# Patient Record
Sex: Male | Born: 1935 | Hispanic: No | Smoking: Never smoker
Health system: Southern US, Community
[De-identification: ages and names within clinical notes are randomized; demographics above are authoritative.]

## PROBLEM LIST (undated history)

## (undated) DIAGNOSIS — N1832 Chronic kidney disease, stage 3b: Secondary | ICD-10-CM

## (undated) DIAGNOSIS — C61 Malignant neoplasm of prostate: Secondary | ICD-10-CM

## (undated) DIAGNOSIS — C801 Malignant (primary) neoplasm, unspecified: Secondary | ICD-10-CM

## (undated) DIAGNOSIS — N200 Calculus of kidney: Secondary | ICD-10-CM

## (undated) DIAGNOSIS — I4891 Unspecified atrial fibrillation: Secondary | ICD-10-CM

## (undated) DIAGNOSIS — E119 Type 2 diabetes mellitus without complications: Secondary | ICD-10-CM

## (undated) DIAGNOSIS — H3530 Unspecified macular degeneration: Secondary | ICD-10-CM

## (undated) DIAGNOSIS — E1142 Type 2 diabetes mellitus with diabetic polyneuropathy: Secondary | ICD-10-CM

## (undated) DIAGNOSIS — E785 Hyperlipidemia, unspecified: Secondary | ICD-10-CM

## (undated) DIAGNOSIS — Z6827 Body mass index (BMI) 27.0-27.9, adult: Secondary | ICD-10-CM

## (undated) HISTORY — DX: Unspecified atrial fibrillation: I48.91

## (undated) HISTORY — DX: Hyperlipidemia, unspecified: E78.5

## (undated) HISTORY — DX: Malignant neoplasm of prostate: C61

## (undated) HISTORY — PX: APPENDECTOMY: SHX54

## (undated) HISTORY — DX: Calculus of kidney: N20.0

## (undated) HISTORY — DX: Chronic kidney disease, stage 3b: N18.32

## (undated) HISTORY — DX: Type 2 diabetes mellitus with diabetic polyneuropathy: E11.42

## (undated) HISTORY — DX: Body mass index (BMI) 27.0-27.9, adult: Z68.27

## (undated) HISTORY — DX: Type 2 diabetes mellitus without complications: E11.9

---

## 2007-07-30 ENCOUNTER — Ambulatory Visit: Admission: RE | Admit: 2007-07-30 | Discharge: 2007-09-11 | Payer: Self-pay | Admitting: Radiation Oncology

## 2007-09-18 ENCOUNTER — Ambulatory Visit: Admission: RE | Admit: 2007-09-18 | Discharge: 2007-10-01 | Payer: Self-pay | Admitting: Radiation Oncology

## 2008-05-05 ENCOUNTER — Ambulatory Visit: Admission: RE | Admit: 2008-05-05 | Discharge: 2008-08-03 | Payer: Self-pay | Admitting: Radiation Oncology

## 2008-06-11 ENCOUNTER — Ambulatory Visit (HOSPITAL_BASED_OUTPATIENT_CLINIC_OR_DEPARTMENT_OTHER): Admission: RE | Admit: 2008-06-11 | Discharge: 2008-06-11 | Payer: Self-pay | Admitting: Urology

## 2010-05-31 NOTE — Op Note (Signed)
NAME:  Troy Mack, POLIO NO.:  [id number]   MEDICAL RECORD NO.:  [id number]          PATIENT TYPE:  AMB   LOCATION:                               FACILITY:  [facility]   PHYSICIAN:  Excell Seltzer. Annabell Howells, M.D.    DATE OF BIRTH:  1927/05/22   DATE OF PROCEDURE:  DATE OF DISCHARGE:                               OPERATIVE REPORT   PROCEDURE:  Prostate seed implantation and cystoscopy.   PREOPERATIVE DIAGNOSIS:  T1c Gleason 6 adenocarcinoma of the prostate.   POSTOPERATIVE DIAGNOSIS:  T1c Gleason 6 adenocarcinoma of the prostate.   SURGEON:  Bjorn Pippin, MD.   RADIATION ONCOLOGIST:  Billie Lade, MD.   ANESTHESIA:  General.   DRAINS:  16 French Foley catheter.   COMPLICATIONS:  None.   INDICATIONS:  Troy Mack is a 77 year old white male with a Gleason 6  T1c prostate cancer and has a elected seed implantation.  His prostate  volume was originally in excess of 70 mL so he had been on Avodart with  a reduction of his prostate volume to approximately 59 mL and was felt  to be a candidate for seeds.  He remains on Avodart and Flomax.   FINDINGS AT PROCEDURE:  The patient was given Cipro.  He was taken to  the operating room where general anesthetic was induced.  He was fitted  with PAS hose and placed in lithotomy position.  A red rubber catheter  was placed in the rectum.  The perineum was clipped.  His genitalia was  prepped with Betadine solution and a 16 French Foley catheter was  placed.  The balloon was filled with dilute contrast.  The catheter and  genitalia were secured out of the perineal field with an OpSite.  The  ultrasound probe was then assembled, secured to the fixed base, inserted  and the position was adjusted.  The perineum was then prepped with  Betadine solution and the seed guide templating was placed.  Stabilization needles were then placed into the prostate.  At this point  using the Nucletron device the prostate was contoured and the seed plan  was created.   Once the seed plan had been created I returned to the procedure and  placed the needles per the intraoperative plan.  A total of 26 needles  were used with a total of 82 seeds implanted.  In the midportion of the  procedure while implanting the C3 locus we did have a failed delivery  that required hand delivery of the seeds and replacement of the delivery  cannula.  No other problems occurred during the procedure.  Once all  seeds were in position the ultrasound stabilization needles and  ultrasound probe were removed.  The Foley catheter was removed and  cystoscopy was performed with a 16 French flexible scope after reprep of  the genitalia.  This examination revealed a normal urethra.  The  external sphincter was intact.  The prostatic urethra had been  penetrated by a needle.  There was a spacer in the prostatic urethra.  However, I did not see any seeds either in the  prostatic urethra or the  bladder.  The prostate was somewhat elongated with bilobar hyperplasia.  The bladder had mild trabeculation.  No tumors or stones were noted.  The ureteral orifices were unremarkable.  There was a small amount of  clot at the base of the bladder.  The cystoscope was then removed.  The  fresh 16 French Foley catheter was placed.  The bladder was irrigated  with return of the small clots and then the catheter was placed to  straight drainage.  The patient was taken down from lithotomy position  after his perineum had been cleansed and dressed.  His anesthetic was  reversed.  He was removed to the recovery room in stable condition.  There were no complications.  At the end of the procedure before he was  taken down from lithotomy position fluoroscopy was removed to evaluate  the seed position.  There were some trailing seeds from the C3 position  but the remainder of the implant was felt to be appropriately placed and  then he was taken down from lithotomy position, his anesthetic  was  reversed, he was removed to recovery in stable condition.  There were no  other complications.      Excell Seltzer. Annabell Howells, M.D.  Electronically Signed     JJW/MEDQ  D:  06/11/2008  T:  06/11/2008  Job:  [id number]   cc:   Billie Lade, M.D.  Fax: [phone]   Jeanmarie Plant  Fax: [phone]

## 2012-01-25 ENCOUNTER — Observation Stay (HOSPITAL_COMMUNITY): Payer: Medicare Other

## 2012-01-25 ENCOUNTER — Encounter (HOSPITAL_COMMUNITY): Payer: Self-pay | Admitting: Emergency Medicine

## 2012-01-25 ENCOUNTER — Inpatient Hospital Stay (HOSPITAL_COMMUNITY): Payer: Medicare Other | Admitting: Anesthesiology

## 2012-01-25 ENCOUNTER — Inpatient Hospital Stay (HOSPITAL_COMMUNITY)
Admission: EM | Admit: 2012-01-25 | Discharge: 2012-01-26 | DRG: 419 | Disposition: A | Discharge status: Home / Self Care | Payer: Medicare Other | Attending: General Surgery | Admitting: General Surgery

## 2012-01-25 ENCOUNTER — Encounter (HOSPITAL_COMMUNITY): Payer: Self-pay | Admitting: Anesthesiology

## 2012-01-25 ENCOUNTER — Encounter (HOSPITAL_COMMUNITY): Admission: EM | Disposition: A | Discharge status: Home / Self Care | Payer: Self-pay | Source: Home / Self Care

## 2012-01-25 DIAGNOSIS — K810 Acute cholecystitis: Principal | ICD-10-CM | POA: Diagnosis present

## 2012-01-25 DIAGNOSIS — Z87442 Personal history of urinary calculi: Secondary | ICD-10-CM

## 2012-01-25 DIAGNOSIS — Z79899 Other long term (current) drug therapy: Secondary | ICD-10-CM

## 2012-01-25 DIAGNOSIS — Z886 Allergy status to analgesic agent status: Secondary | ICD-10-CM

## 2012-01-25 DIAGNOSIS — H3530 Unspecified macular degeneration: Secondary | ICD-10-CM | POA: Diagnosis present

## 2012-01-25 DIAGNOSIS — Z8546 Personal history of malignant neoplasm of prostate: Secondary | ICD-10-CM

## 2012-01-25 DIAGNOSIS — K8010 Calculus of gallbladder with chronic cholecystitis without obstruction: Secondary | ICD-10-CM

## 2012-01-25 DIAGNOSIS — Z9089 Acquired absence of other organs: Secondary | ICD-10-CM

## 2012-01-25 HISTORY — PX: CHOLECYSTECTOMY: SHX55

## 2012-01-25 HISTORY — DX: Calculus of kidney: N20.0

## 2012-01-25 HISTORY — DX: Malignant (primary) neoplasm, unspecified: C80.1

## 2012-01-25 HISTORY — DX: Unspecified macular degeneration: H35.30

## 2012-01-25 LAB — CBC WITH DIFFERENTIAL/PLATELET
Hemoglobin: 13.6 g/dL (ref 13.0–17.0)
Lymphocytes Relative: 10 % — ABNORMAL LOW (ref 12–46)
Lymphs Abs: 1.6 10*3/uL (ref 0.7–4.0)
Monocytes Relative: 8 % (ref 3–12)
Neutro Abs: 13 10*3/uL — ABNORMAL HIGH (ref 1.7–7.7)
Neutrophils Relative %: 82 % — ABNORMAL HIGH (ref 43–77)
Platelets: 272 10*3/uL (ref 150–400)
RBC: 4.38 MIL/uL (ref 4.22–5.81)
WBC: 15.9 10*3/uL — ABNORMAL HIGH (ref 4.0–10.5)

## 2012-01-25 LAB — COMPREHENSIVE METABOLIC PANEL
BUN: 18 mg/dL (ref 6–23)
CO2: 25 mEq/L (ref 19–32)
Calcium: 9.2 mg/dL (ref 8.4–10.5)
Chloride: 102 mEq/L (ref 96–112)
Creatinine, Ser: 1.26 mg/dL (ref 0.50–1.35)
GFR calc non Af Amer: 51 mL/min — ABNORMAL LOW (ref >90)
Total Bilirubin: 1.1 mg/dL (ref 0.3–1.2)

## 2012-01-25 SURGERY — LAPAROSCOPIC CHOLECYSTECTOMY
Anesthesia: General | Site: Abdomen | Wound class: Contaminated

## 2012-01-25 MED ORDER — DEXAMETHASONE SODIUM PHOSPHATE 4 MG/ML IJ SOLN
INTRAMUSCULAR | Status: DC | PRN
  Administered 2012-01-25: 8 mg via INTRAVENOUS

## 2012-01-25 MED ORDER — HYDROMORPHONE HCL PF 1 MG/ML IJ SOLN
0.5000 mg | INTRAMUSCULAR | Status: DC | PRN
  Administered 2012-01-25 (×3): 0.5 mg via INTRAVENOUS
  Filled 2012-01-25 (×3): qty 1

## 2012-01-25 MED ORDER — FENTANYL CITRATE 0.05 MG/ML IJ SOLN: 25.0000 ug | INTRAMUSCULAR | Status: DC | PRN

## 2012-01-25 MED ORDER — ONDANSETRON HCL 4 MG/2ML IJ SOLN: 4.0000 mg | Freq: Four times a day (QID) | INTRAMUSCULAR | Status: DC | PRN

## 2012-01-25 MED ORDER — SODIUM CHLORIDE 0.9 % IR SOLN
Status: DC | PRN
  Administered 2012-01-25 (×2): 1000 mL

## 2012-01-25 MED ORDER — LIDOCAINE HCL (CARDIAC) 20 MG/ML IV SOLN
INTRAVENOUS | Status: DC | PRN
  Administered 2012-01-25: 60 mg via INTRAVENOUS

## 2012-01-25 MED ORDER — DEXTROSE 5 % IV SOLN
2.0000 g | Freq: Four times a day (QID) | INTRAVENOUS | Status: DC
  Administered 2012-01-25 – 2012-01-26 (×5): 2 g via INTRAVENOUS
  Filled 2012-01-25 (×11): qty 2

## 2012-01-25 MED ORDER — MIDAZOLAM HCL 5 MG/5ML IJ SOLN
INTRAMUSCULAR | Status: DC | PRN
  Administered 2012-01-25: 1 mg via INTRAVENOUS

## 2012-01-25 MED ORDER — BUPIVACAINE-EPINEPHRINE 0.25% -1:200000 IJ SOLN
INTRAMUSCULAR | Status: AC
  Filled 2012-01-25: qty 1

## 2012-01-25 MED ORDER — 0.9 % SODIUM CHLORIDE (POUR BTL) OPTIME
TOPICAL | Status: DC | PRN
  Administered 2012-01-25: 1000 mL

## 2012-01-25 MED ORDER — OXYCODONE-ACETAMINOPHEN 5-325 MG PO TABS
1.0000 | ORAL_TABLET | Freq: Four times a day (QID) | ORAL | Status: DC | PRN
  Administered 2012-01-25: 2 via ORAL
  Filled 2012-01-25: qty 2

## 2012-01-25 MED ORDER — HEMOSTATIC AGENTS (NO CHARGE) OPTIME
TOPICAL | Status: DC | PRN
  Administered 2012-01-25: 1 via TOPICAL

## 2012-01-25 MED ORDER — PROPOFOL 10 MG/ML IV BOLUS
INTRAVENOUS | Status: DC | PRN
  Administered 2012-01-25: 200 mg via INTRAVENOUS

## 2012-01-25 MED ORDER — FENTANYL CITRATE 0.05 MG/ML IJ SOLN
INTRAMUSCULAR | Status: DC | PRN
  Administered 2012-01-25: 50 ug via INTRAVENOUS
  Administered 2012-01-25 (×2): 100 ug via INTRAVENOUS

## 2012-01-25 MED ORDER — ONDANSETRON HCL 4 MG/2ML IJ SOLN
INTRAMUSCULAR | Status: DC | PRN
  Administered 2012-01-25: 4 mg via INTRAVENOUS

## 2012-01-25 MED ORDER — METOCLOPRAMIDE HCL 5 MG/ML IJ SOLN: 10.0000 mg | Freq: Once | INTRAMUSCULAR | Status: DC | PRN

## 2012-01-25 MED ORDER — GLYCOPYRROLATE 0.2 MG/ML IJ SOLN
INTRAMUSCULAR | Status: DC | PRN
  Administered 2012-01-25: 0.4 mg via INTRAVENOUS

## 2012-01-25 MED ORDER — ROCURONIUM BROMIDE 100 MG/10ML IV SOLN
INTRAVENOUS | Status: DC | PRN
  Administered 2012-01-25: 10 mg via INTRAVENOUS
  Administered 2012-01-25: 40 mg via INTRAVENOUS

## 2012-01-25 MED ORDER — NEOSTIGMINE METHYLSULFATE 1 MG/ML IJ SOLN
INTRAMUSCULAR | Status: DC | PRN
  Administered 2012-01-25: 3 mg via INTRAVENOUS

## 2012-01-25 MED ORDER — SODIUM CHLORIDE 0.9 % IV SOLN
INTRAVENOUS | Status: DC | PRN
  Administered 2012-01-25: 13:00:00

## 2012-01-25 MED ORDER — BIOTENE DRY MOUTH MT LIQD
15.0000 mL | Freq: Two times a day (BID) | OROMUCOSAL | Status: DC
  Administered 2012-01-25: 15 mL via OROMUCOSAL

## 2012-01-25 MED ORDER — ONDANSETRON HCL 4 MG/2ML IJ SOLN
4.0000 mg | Freq: Once | INTRAMUSCULAR | Status: AC
  Administered 2012-01-25: 4 mg via INTRAVENOUS
  Filled 2012-01-25: qty 2

## 2012-01-25 MED ORDER — LABETALOL HCL 5 MG/ML IV SOLN
INTRAVENOUS | Status: DC | PRN
  Administered 2012-01-25: 5 mg via INTRAVENOUS

## 2012-01-25 MED ORDER — EPHEDRINE SULFATE 50 MG/ML IJ SOLN
INTRAMUSCULAR | Status: DC | PRN
  Administered 2012-01-25: 10 mg via INTRAVENOUS
  Administered 2012-01-25: 5 mg via INTRAVENOUS

## 2012-01-25 MED ORDER — HYDROMORPHONE HCL PF 1 MG/ML IJ SOLN
0.5000 mg | Freq: Once | INTRAMUSCULAR | Status: AC
  Administered 2012-01-25: 0.5 mg via INTRAVENOUS
  Filled 2012-01-25: qty 1

## 2012-01-25 MED ORDER — LACTATED RINGERS IV SOLN
INTRAVENOUS | Status: DC | PRN
  Administered 2012-01-25 (×2): via INTRAVENOUS

## 2012-01-25 MED ORDER — POTASSIUM CHLORIDE IN NACL 20-0.9 MEQ/L-% IV SOLN
INTRAVENOUS | Status: DC
  Administered 2012-01-25 (×2): via INTRAVENOUS
  Filled 2012-01-25 (×6): qty 1000

## 2012-01-25 MED ORDER — HYDROMORPHONE HCL PF 1 MG/ML IJ SOLN
INTRAMUSCULAR | Status: DC | PRN
  Administered 2012-01-25: 0.5 mg via INTRAVENOUS

## 2012-01-25 MED ORDER — BUPIVACAINE-EPINEPHRINE 0.25% -1:200000 IJ SOLN
INTRAMUSCULAR | Status: DC | PRN
  Administered 2012-01-25: 17 mL

## 2012-01-25 MED ORDER — CHLORHEXIDINE GLUCONATE 0.12 % MT SOLN
15.0000 mL | Freq: Two times a day (BID) | OROMUCOSAL | Status: DC
  Administered 2012-01-25 (×2): 15 mL via OROMUCOSAL
  Filled 2012-01-25 (×2): qty 15

## 2012-01-25 SURGICAL SUPPLY — 52 items
APPLIER CLIP 5 13 M/L LIGAMAX5 (MISCELLANEOUS) ×3
APPLIER CLIP ROT 10 11.4 M/L (STAPLE)
BLADE SURG ROTATE 9660 (MISCELLANEOUS) ×3 IMPLANT
CANISTER SUCTION 2500CC (MISCELLANEOUS) ×3 IMPLANT
CHLORAPREP W/TINT 26ML (MISCELLANEOUS) ×3 IMPLANT
CLIP APPLIE 5 13 M/L LIGAMAX5 (MISCELLANEOUS) ×2 IMPLANT
CLIP APPLIE ROT 10 11.4 M/L (STAPLE) IMPLANT
CLOTH BEACON ORANGE TIMEOUT ST (SAFETY) ×3 IMPLANT
COVER MAYO STAND STRL (DRAPES) ×3 IMPLANT
COVER SURGICAL LIGHT HANDLE (MISCELLANEOUS) ×3 IMPLANT
DECANTER SPIKE VIAL GLASS SM (MISCELLANEOUS) ×3 IMPLANT
DERMABOND ADVANCED (GAUZE/BANDAGES/DRESSINGS) ×1
DERMABOND ADVANCED .7 DNX12 (GAUZE/BANDAGES/DRESSINGS) ×2 IMPLANT
DRAPE C-ARM 42X72 X-RAY (DRAPES) ×3 IMPLANT
DRAPE UTILITY 15X26 W/TAPE STR (DRAPE) ×6 IMPLANT
ELECT REM PT RETURN 9FT ADLT (ELECTROSURGICAL) ×3
ELECTRODE REM PT RTRN 9FT ADLT (ELECTROSURGICAL) ×2 IMPLANT
FILTER SMOKE EVAC LAPAROSHD (FILTER) ×3 IMPLANT
GLOVE BIO SURGEON STRL SZ7 (GLOVE) ×3 IMPLANT
GLOVE BIO SURGEON STRL SZ7.5 (GLOVE) ×3 IMPLANT
GLOVE BIO SURGEON STRL SZ8 (GLOVE) ×3 IMPLANT
GLOVE BIOGEL PI IND STRL 6.5 (GLOVE) ×2 IMPLANT
GLOVE BIOGEL PI IND STRL 7.0 (GLOVE) ×4 IMPLANT
GLOVE BIOGEL PI IND STRL 7.5 (GLOVE) ×4 IMPLANT
GLOVE BIOGEL PI IND STRL 8 (GLOVE) ×6 IMPLANT
GLOVE BIOGEL PI INDICATOR 6.5 (GLOVE) ×1
GLOVE BIOGEL PI INDICATOR 7.0 (GLOVE) ×2
GLOVE BIOGEL PI INDICATOR 7.5 (GLOVE) ×2
GLOVE BIOGEL PI INDICATOR 8 (GLOVE) ×3
GLOVE ECLIPSE 6.5 STRL STRAW (GLOVE) ×3 IMPLANT
GLOVE ECLIPSE 7.0 STRL STRAW (GLOVE) ×3 IMPLANT
GLOVE ECLIPSE 7.5 STRL STRAW (GLOVE) ×3 IMPLANT
GOWN PREVENTION PLUS XLARGE (GOWN DISPOSABLE) ×3 IMPLANT
GOWN STRL NON-REIN LRG LVL3 (GOWN DISPOSABLE) ×18 IMPLANT
HEMOSTAT SNOW SURGICEL 2X4 (HEMOSTASIS) ×3 IMPLANT
KIT BASIN OR (CUSTOM PROCEDURE TRAY) ×3 IMPLANT
KIT ROOM TURNOVER OR (KITS) ×3 IMPLANT
NS IRRIG 1000ML POUR BTL (IV SOLUTION) ×3 IMPLANT
PAD ARMBOARD 7.5X6 YLW CONV (MISCELLANEOUS) ×3 IMPLANT
POUCH SPECIMEN RETRIEVAL 10MM (ENDOMECHANICALS) ×3 IMPLANT
SCISSORS LAP 5X35 DISP (ENDOMECHANICALS) ×3 IMPLANT
SET CHOLANGIOGRAPH 5 50 .035 (SET/KITS/TRAYS/PACK) ×3 IMPLANT
SET IRRIG TUBING LAPAROSCOPIC (IRRIGATION / IRRIGATOR) ×3 IMPLANT
SPECIMEN JAR SMALL (MISCELLANEOUS) ×3 IMPLANT
SUT VIC AB 4-0 PS2 27 (SUTURE) ×3 IMPLANT
TOWEL OR 17X24 6PK STRL BLUE (TOWEL DISPOSABLE) IMPLANT
TOWEL OR 17X26 10 PK STRL BLUE (TOWEL DISPOSABLE) ×3 IMPLANT
TOWEL OR NON WOVEN STRL DISP B (DISPOSABLE) ×3 IMPLANT
TRAY LAPAROSCOPIC (CUSTOM PROCEDURE TRAY) ×3 IMPLANT
TROCAR HASSON GELL 12X100 (TROCAR) ×3 IMPLANT
TROCAR Z-THREAD FIOS 5X100MM (TROCAR) ×9 IMPLANT
WATER STERILE IRR 1000ML POUR (IV SOLUTION) IMPLANT

## 2012-01-25 NOTE — ED Notes (Signed)
Dr Magnus Ivan paged regarding pts arrival

## 2012-01-25 NOTE — Preoperative (Signed)
Beta Blockers   Reason not to administer Beta Blockers:Not Applicable 

## 2012-01-25 NOTE — Progress Notes (Signed)
  Subjective: RUQ pain improved  Objective: Vital signs in last 24 hours: Temp:  [98 F (36.7 C)-98.6 F (37 C)] 98.6 F (37 C) (01/09 0226) Pulse Rate:  [76-83] 80  (01/09 0226) Resp:  [20] 20  (01/09 0226) BP: (111-132)/(51-62) 132/62 mmHg (01/09 0226) SpO2:  [93 %-100 %] 97 % (01/09 0226) Weight:  [86.183 kg (190 lb)] 86.183 kg (190 lb) (01/09 0016) Last BM Date: 01/24/12  Intake/Output from previous day: 01/08 0701 - 01/09 0700 In: 230 [I.V.:230] Out: 320 [Urine:320] Intake/Output this shift:    General appearance: alert and cooperative Resp: clear to auscultation bilaterally Cardio: regular rate and rhythm GI: soft, tender RUQ, no masses  Lab Results:   Basename 01/25/12 0056  WBC 15.9*  HGB 13.6  HCT 39.6  PLT 272   BMET  Basename 01/25/12 0056  NA 135  K 4.5  CL 102  CO2 25  GLUCOSE 112*  BUN 18  CREATININE 1.26  CALCIUM 9.2   PT/INR No results found for this basename: LABPROT:2,INR:2 in the last 72 hours ABG No results found for this basename: PHART:2,PCO2:2,PO2:2,HCO3:2 in the last 72 hours  Studies/Results: Dg Chest 2 View  01/25/2012  *RADIOLOGY REPORT*  Clinical Data: Abdominal pain and nausea.  CHEST - 2 VIEW  Comparison: PA and lateral chest 05/18/2008.  Findings: Linear atelectasis is seen in the lung bases.  Lungs otherwise clear.  No pneumothorax or effusion.  Heart size upper normal.  IMPRESSION: No acute finding.  Bibasilar atelectasis.   Original Report Authenticated By: Holley Dexter, M.D.     Anti-infectives: Anti-infectives     Start     Dose/Rate Route Frequency Ordered Stop   01/25/12 0230   cefOXitin (MEFOXIN) 2 g in dextrose 5 % 50 mL IVPB        2 g 100 mL/hr over 30 Minutes Intravenous 4 times per day 01/25/12 0228            Assessment/Plan: s/p Procedure(s) (LRB) with comments: LAPAROSCOPIC CHOLECYSTECTOMY WITH INTRAOPERATIVE CHOLANGIOGRAM (N/A) Acute cholecystitis - fro lap chole/IOC today. CT reviewed.  I  spoke to him and his son. I discussed the procedure in detail.  We discussed the risks and benefits of a laparoscopic cholecystectomy and possible cholangiogram including, but not limited to bleeding, infection, injury to surrounding structures such as the intestine or liver, bile leak, retained gallstones, need to convert to an open procedure, prolonged diarrhea, blood clots such as  DVT, common bile duct injury, anesthesia risks, and possible need for additional procedures.  The likelihood of improvement in symptoms and return to the patient's normal status is good. We discussed the typical post-operative recovery course.  Patient agrees.   LOS: 0 days    Waylon Hershey E 01/25/2012

## 2012-01-25 NOTE — Anesthesia Preprocedure Evaluation (Addendum)
Anesthesia Evaluation  Patient identified by MRN, date of birth, ID band Patient awake    Reviewed: Allergy & Precautions, H&P , NPO status , Patient's Chart, lab work & pertinent test results, reviewed documented beta blocker date and time   Airway Mallampati: II TM Distance: >3 FB Neck ROM: full    Dental  (+) Poor Dentition, Edentulous Upper, Partial Lower and Dental Advisory Given,    Pulmonary neg pulmonary ROS,  breath sounds clear to auscultation        Cardiovascular negative cardio ROS  Rhythm:regular     Neuro/Psych negative neurological ROS  negative psych ROS   GI/Hepatic negative GI ROS, Neg liver ROS,   Endo/Other  negative endocrine ROS  Renal/GU Renal disease  negative genitourinary   Musculoskeletal   Abdominal   Peds  Hematology negative hematology ROS (+)   Anesthesia Other Findings See surgeon's H&P   Reproductive/Obstetrics negative OB ROS                          Anesthesia Physical Anesthesia Plan  ASA: II and emergent  Anesthesia Plan: General   Post-op Pain Management:    Induction: Intravenous, Rapid sequence and Cricoid pressure planned  Airway Management Planned: Oral ETT  Additional Equipment:   Intra-op Plan:   Post-operative Plan: Extubation in OR  Informed Consent: I have reviewed the patients History and Physical, chart, labs and discussed the procedure including the risks, benefits and alternatives for the proposed anesthesia with the patient or authorized representative who has indicated his/her understanding and acceptance.   Dental Advisory Given  Plan Discussed with: CRNA and Surgeon  Anesthesia Plan Comments:         Anesthesia Quick Evaluation

## 2012-01-25 NOTE — ED Notes (Signed)
Pt transferred from [facility] to [facility] via EMS. Pt stable. No signs of distress. Pt states he has been having abdominal pain since last thursday and started vomiting on Friday. Pt is transferred here for Acute Cholecystitis. No family at present time. VS stable. 20g IV in place in left AC, intact with fluids going.

## 2012-01-25 NOTE — Transfer of Care (Signed)
Immediate Anesthesia Transfer of Care Note  Patient: Troy Mack  Procedure(s) Performed: Procedure(s) (LRB) with comments: LAPAROSCOPIC CHOLECYSTECTOMY (N/A)  Patient Location: PACU  Anesthesia Type:General  Level of Consciousness: awake, alert  and oriented  Airway & Oxygen Therapy: Patient Spontanous Breathing and Patient connected to nasal cannula oxygen  Post-op Assessment: Report given to PACU RN and Post -op Vital signs reviewed and stable  Post vital signs: Reviewed and stable  Complications: No apparent anesthesia complications

## 2012-01-25 NOTE — ED Provider Notes (Signed)
History     CSN: [id number]  Arrival date & time 01/25/12  0001   First MD Initiated Contact with Patient 01/25/12 0014      Chief Complaint  Patient presents with  . Abdominal Pain  . Nausea    (Consider location/radiation/quality/duration/timing/severity/associated sxs/prior treatment) HPIWilliam Mack is a 79 y.o. male who presents with right upper quadrant pain as a transfer from [facility].  Patient says about last Thursday he ate some collard greens and "some other things" he started having right upper quadrant pain. He has had some associated nausea and vomiting as well. The pain was worse today, is been severe, it is currently 8/10, sharp, right upper quadrant, nonradiating, associated with some mild nausea, no diarrhea, no fevers, no chills.  Patient has no other pertinent medical history.  Was treated for prostate cancer in 2010 bradycardia therapy, he was taking Flomax but no longer takes it because he hasn't needed. Currently takes a baby aspirin daily. No known heart disease, no known lung disease. No diabetes.   Past Medical History  Diagnosis Date  . Kidney stones   . Cancer     Prostate May 2010 diagnosed  . Macular degeneration     Past Surgical History  Procedure Date  . Appendectomy     History reviewed. No pertinent family history.  History  Substance Use Topics  . Smoking status: Never Smoker   . Smokeless tobacco: Never Used  . Alcohol Use: No      Review of Systems At least 10pt or greater review of systems completed and are negative except where specified in the HPI.  Allergies  Asa  Home Medications  No current outpatient prescriptions on file.  BP 111/57  Pulse 76  Temp 98 F (36.7 C) (Oral)  Resp 20  Ht 6\' 2"  (1.88 m)  Wt 190 lb (86.183 kg)  BMI 24.39 kg/m2  SpO2 97%  Physical Exam  Nursing notes reviewed.  Electronic medical record reviewed. VITAL SIGNS:   Filed Vitals:   01/25/12 0003 01/25/12 0010  01/25/12 0016  BP: 122/51  111/57  Pulse: 83  76  Temp: 98 F (36.7 C)    TempSrc: Oral    Resp: 20  20  Height:   6\' 2"  (1.88 m)  Weight:   190 lb (86.183 kg)  SpO2: 100% 93% 97%   CONSTITUTIONAL: Awake, oriented, appears non-toxic HENT: Atraumatic, normocephalic, oral mucosa pink and moist, airway patent. Nares patent without drainage. External ears normal. EYES: Conjunctiva clear, EOMI, PERRLA NECK: Trachea midline, non-tender, supple CARDIOVASCULAR: Normal heart rate, Normal rhythm, No murmurs, rubs, gallops PULMONARY/CHEST: Clear to auscultation, no rhonchi, wheezes, or rales. Symmetrical breath sounds. Non-tender. ABDOMINAL: Non-distended, soft, right upper quadrant tenderness to palpation without rebound, voluntary guarding.  Positive sonographic Murphy. BS normal. NEUROLOGIC: Non-focal, moving all four extremities, no gross sensory or motor deficits. EXTREMITIES: No clubbing, cyanosis, or edema SKIN: Warm, Dry, No erythema, No rash  ED Course  [place] bedside Performed by: Jones Skene Authorized by: Jones Skene Consent: Verbal consent obtained. Comments: Bedside ultrasound reveals a distended gallbladder with gallstones in the fundus. Cystic duct is poorly visualized due to some bowel gas, the patient's gallbladder wall does appear thickened with some scant pericholecystic fluid.   (including critical care time)  Date: 01/25/2012  Rate: 79  Rhythm: normal sinus rhythm  QRS Axis: Left axis deviation  Intervals: normal  ST/T Wave abnormalities: normal  Conduction Disutrbances: none  Narrative Interpretation: unremarkable  Labs Reviewed  COMPREHENSIVE METABOLIC PANEL - Abnormal; Notable for the following:    Glucose, Bld 112 (*)     Albumin 3.1 (*)     GFR calc non Af Amer 51 (*)     GFR calc Af Amer 60 (*)     All other components within normal limits  CBC WITH DIFFERENTIAL - Abnormal; Notable for the following:    WBC 15.9 (*)     Neutrophils Relative 82 (*)      Neutro Abs 13.0 (*)     Lymphocytes Relative 10 (*)     Monocytes Absolute 1.3 (*)     All other components within normal limits   Dg Chest 2 View  01/25/2012  *RADIOLOGY REPORT*  Clinical Data: Abdominal pain and nausea.  CHEST - 2 VIEW  Comparison: PA and lateral chest 05/18/2008.  Findings: Linear atelectasis is seen in the lung bases.  Lungs otherwise clear.  No pneumothorax or effusion.  Heart size upper normal.  IMPRESSION: No acute finding.  Bibasilar atelectasis.   Original Report Authenticated By: Holley Dexter, M.D.      No diagnosis found.    MDM  [name] is a 79 y.o. male presents from [facility] with complaint of right upper quadrant pain possible cholecystitis. There is no read accompanying the CDs from [facility]. CMP reviewed showing slight elevation in BUN and creatinine at 21 and 1.4 likely secondary to some mild dehydration from vomiting, some ketones in the urine no urinary tract infection.  Patient does have a white count of 15,000 as obtained at [facility]. My bedside ultrasound shows patient does have pain when palpated over the gallbladder, gallbladder is distended with a slightly thickened gallbladder wall small amount pericholecystic fluid and gallbladder stones, duct is not well-visualized in my bedside exam a disease and stones in the fundus. We'll send patient for a formal study to better evaluate the patient's gallbladder, there is no radiology read accompanying his documentation showing an ultrasound was done at [facility].  Discussed with Dr. Magnus Ivan - patient to be admitted for cholecystectomy. Without any chronic illnesses, patient's likely ASA class II.        Jones Skene, MD 01/26/12 [phone]

## 2012-01-25 NOTE — Op Note (Signed)
01/25/2012  2:24 PM  PATIENT:  Troy Mack  79 y.o. male  PRE-OPERATIVE DIAGNOSIS:  CHOLECYSTITIS  POST-OPERATIVE DIAGNOSIS:  CHOLECYSTITIS  PROCEDURE:  Procedure(s): LAPAROSCOPIC CHOLECYSTECTOMY  SURGEON:  Violeta Gelinas, M.D.  PHYSICIAN ASSISTANT:   ASSISTANTS: Jimmye Norman, M.D., Barnetta Chapel, [facility]   ANESTHESIA:   local and general  EBL:  Total I/O In: 1000 [I.V.:1000] Out: 100 [Urine:100]  BLOOD ADMINISTERED:none  DRAINS: none   SPECIMEN:  Excision  DISPOSITION OF SPECIMEN:  PATHOLOGY  COUNTS:  YES  DICTATION: .Dragon Dictation  Patient was identified in the preop holding area. Informed consent was obtained. He is on antibiotics IV. He was brought to the operating room and general endotracheal anesthesia was administered by anesthesia staff. Abdomen was prepped and draped in sterile fashion. We did time out procedure. An umbilical region was infiltrated with quarter percent Marcaine with epinephrine. Infraumbilical incision was made. Subcutaneous tissues were dissected down revealing the anterior fascia. This was divided along the midline. Peritoneal cavity was entered under direct vision. 0 Vicryl pursestring suture was placed on the fascial opening. Hassan trocar was inserted into the abdomen. Abdomen was insufflated with carbon dioxide in standard fashion. Under direct vision a 5 mm epigastric and 5 malleolar lateral ports x2 were placed. Local was used at each port site. Laparoscopic exploration revealed the gallbladder hidden by Clydie Braun omentum over the dome. This process was also stuck to the right upper quadrant abdominal sidewall. Gradual gentle dissection freed up the omentum from the dome. The dome the gallbladder was very friable. It was retracted superior medially. The rest of the omentum was gradually dissected down revealing the infundibulum. This was retracted inferolaterally. Dissection began laterally and progressed medially. This was very tedious. There was  significant amount of subacute inflammation. This is consistent with the patient having pain for 2 weeks. Progressively identified a small anterior arterial branch with lymph node. This was completely dissected out. Were able to visualize the cystic duct as well. He was extremely short. The common bile duct and cystic duct junction was well visualized. Careful further dissection help delineate the structures. Next the small anterior artery was clipped twice proximally once distally and divided. The cystic duct was then further dissected until we had a critical view between the cystic duct infundibulum and the common bile duct. At this point the cystic duct appeared to be about 8 mm in length precluding safe cholangiogram. Liver function tests are normal. We did not do a cholangiogram. 3 clips were placed proximally on the cystic duct one was placed distally and it was divided. The bladder was then dissected off the liver bed. We identified the cystic artery. It was clipped twice proximally once distally and divided. We also identified another small branch which was clipped proximally and divided distally with cautery. Gallbladder was taken to [facility] off the liver bed with cautery achieving good hemostasis. Gallbladder was placed in an Endo Catch bag and removed from the infraumbilical port site. Liver bed was cauterized to get excellent hemostasis. We copiously irrigated the abdomen irrigation fluid returned clear. We placed a piece of Surgicel snow in the liver bed. This looked dry and clips remain in excellent position. Ports were removed under direct vision. Pneumoperitoneum was released. Infraumbilical fascia was closed by tying the 0 Vicryl pursestring suture with care not to trap the nature abdominal contents. All 4 wounds were irrigated and the skin of each was closed with running 4 Vicryl subcutaneous tissue followed by Dermabond. All counts were correct. Patient  tolerated procedure well and was taken  recovery in stable condition.  PATIENT DISPOSITION:  PACU - hemodynamically stable.   Delay start of Pharmacological VTE agent (>24hrs) due to surgical blood loss or risk of bleeding:  no  Violeta Gelinas, MD, MPH, FACS Pager: [phone]  1/9/20142:24 PM

## 2012-01-25 NOTE — ED Notes (Signed)
MD at bedside with ultrasound

## 2012-01-25 NOTE — H&P (Signed)
Troy Mack is an 79 y.o. male.   Chief Complaint: RUQ abdominal pain HPI: This is an 79 year old gentleman who was transferred from [facility] emergency department where he presented with right upper quadrant abdominal pain. He has had intermittent right upper quadrant abdominal pain for approximately 2 weeks. He has had 2 episodes of emesis. The pain is now sharp and constant and moderate to severe. It does not refer anywhere else.  He is otherwise without complaints. He denies chest pain or shortness of breath. Bowel movements are normal.  Past Medical History  Diagnosis Date  . Kidney stones   . Cancer     Prostate May 2010 diagnosed  . Macular degeneration     Past Surgical History  Procedure Date  . Appendectomy     History reviewed. No pertinent family history. Social History:  reports that he has never smoked. He has never used smokeless tobacco. He reports that he does not drink alcohol or use illicit drugs.  Allergies:  Allergies  Allergen Reactions  . Asa (Aspirin)     Pt states he cannot take 324 ASA due to swelling of the mouth but can take baby ASA 81 mg    Medications Prior to Admission  Medication Sig Dispense Refill  . Multiple Vitamin (MULTIVITAMIN WITH MINERALS) TABS Take 1 tablet by mouth daily.        Results for orders placed during the hospital encounter of 01/25/12 (from the past 48 hour(s))  COMPREHENSIVE METABOLIC PANEL     Status: Abnormal   Collection Time   01/25/12 12:56 AM      Component Value Range Comment   Sodium 135  135 - 145 mEq/L    Potassium 4.5  3.5 - 5.1 mEq/L    Chloride 102  96 - 112 mEq/L    CO2 25  19 - 32 mEq/L    Glucose, Bld 112 (*) 70 - 99 mg/dL    BUN 18  6 - 23 mg/dL    Creatinine, Ser 1.61  0.50 - 1.35 mg/dL    Calcium 9.2  8.4 - 09.6 mg/dL    Total Protein 6.4  6.0 - 8.3 g/dL    Albumin 3.1 (*) 3.5 - 5.2 g/dL    AST 34  0 - 37 U/L    ALT 42  0 - 53 U/L    Alkaline Phosphatase 96  39 - 117 U/L    Total  Bilirubin 1.1  0.3 - 1.2 mg/dL    GFR calc non Af Amer 51 (*) >90 mL/min    GFR calc Af Amer 60 (*) >90 mL/min   CBC WITH DIFFERENTIAL     Status: Abnormal   Collection Time   01/25/12 12:56 AM      Component Value Range Comment   WBC 15.9 (*) 4.0 - 10.5 K/uL    RBC 4.38  4.22 - 5.81 MIL/uL    Hemoglobin 13.6  13.0 - 17.0 g/dL    HCT 04.5  40.9 - 81.1 %    MCV 90.4  78.0 - 100.0 fL    MCH 31.1  26.0 - 34.0 pg    MCHC 34.3  30.0 - 36.0 g/dL    RDW 91.4  78.2 - 95.6 %    Platelets 272  150 - 400 K/uL    Neutrophils Relative 82 (*) 43 - 77 %    Neutro Abs 13.0 (*) 1.7 - 7.7 K/uL    Lymphocytes Relative 10 (*) 12 - 46 %  Lymphs Abs 1.6  0.7 - 4.0 K/uL    Monocytes Relative 8  3 - 12 %    Monocytes Absolute 1.3 (*) 0.1 - 1.0 K/uL    Eosinophils Relative 0  0 - 5 %    Eosinophils Absolute 0.0  0.0 - 0.7 K/uL    Basophils Relative 0  0 - 1 %    Basophils Absolute 0.0  0.0 - 0.1 K/uL    Dg Chest 2 View  01/25/2012  *RADIOLOGY REPORT*  Clinical Data: Abdominal pain and nausea.  CHEST - 2 VIEW  Comparison: PA and lateral chest 05/18/2008.  Findings: Linear atelectasis is seen in the lung bases.  Lungs otherwise clear.  No pneumothorax or effusion.  Heart size upper normal.  IMPRESSION: No acute finding.  Bibasilar atelectasis.   Original Report Authenticated By: Holley Dexter, M.D.     Review of Systems  Constitutional: Negative.   HENT: Negative.   Eyes: Negative.   Respiratory: Negative.   Cardiovascular: Negative.   Gastrointestinal: Positive for nausea, vomiting and abdominal pain.  Genitourinary: Positive for frequency.  Musculoskeletal: Negative.   Skin: Negative.   Neurological: Negative.   Endo/Heme/Allergies: Negative.   Psychiatric/Behavioral: Negative.     Blood pressure 132/62, pulse 80, temperature 98.6 F (37 C), temperature source Oral, resp. rate 20, height 6\' 2"  (1.88 m), weight 190 lb (86.183 kg), SpO2 97.00%. Physical Exam  Constitutional: He is oriented to  person, place, and time. He appears well-developed and well-nourished. No distress.  HENT:  Head: Normocephalic and atraumatic.  Right Ear: External ear normal.  Left Ear: External ear normal.  Nose: Nose normal.  Mouth/Throat: Oropharynx is clear and moist.  Eyes: Conjunctivae normal are normal. Pupils are equal, round, and reactive to light. No scleral icterus.  Neck: Normal range of motion. Neck supple. No tracheal deviation present. No thyromegaly present.  Cardiovascular: Normal rate, regular rhythm, normal heart sounds and intact distal pulses.   No murmur heard. Respiratory: Effort normal and breath sounds normal. No respiratory distress.  GI: Soft. Bowel sounds are normal. He exhibits no distension. There is tenderness. There is guarding.       There is tenderness with guarding in the right upper quadrant  Musculoskeletal: Normal range of motion. He exhibits no edema and no tenderness.  Neurological: He is alert and oriented to person, place, and time.  Skin: Skin is warm and dry. He is not diaphoretic. No erythema.  Psychiatric: His behavior is normal. Judgment normal.     Assessment/Plan Acute cholecystitis with cholelithiasis  I have reviewed the CAT scan from [facility] with the radiologist here.  There is obvious acute cholecystitis with a thickened gallbladder and pericholecystic stranding. He also presents with an elevated white blood count. Liver function tests are normal.  He is functional and has relatively little comorbidities. I will keep him n.p.o. For now with plans for possible cholecystectomy today. Given the CAT scan findings, I have canceled the ultrasound that was ordered.  Chaden Doom A 01/25/2012, 6:19 AM

## 2012-01-25 NOTE — Anesthesia Postprocedure Evaluation (Signed)
Anesthesia Post Note  Patient: Troy Mack  Procedure(s) Performed: Procedure(s) (LRB): LAPAROSCOPIC CHOLECYSTECTOMY (N/A)  Anesthesia type: General  Patient location: PACU  Post pain: Pain level controlled and Adequate analgesia  Post assessment: Post-op Vital signs reviewed, Patient's Cardiovascular Status Stable, Respiratory Function Stable, Patent Airway and Pain level controlled  Last Vitals:  Filed Vitals:   01/25/12 1445  BP:   Pulse: 61  Temp:   Resp: 16    Post vital signs: Reviewed and stable  Level of consciousness: awake, alert  and oriented  Complications: No apparent anesthesia complications

## 2012-01-25 NOTE — ED Notes (Addendum)
Pt is tx from [facility]. Pt is to be evaluated for rule out acute cholecystis, pt has been complaining of right upper quadrant pain x 1 week. Dr Magnus Ivan to see pt and possible triad admit. Pt with 20g(L)AC. Pt received 4mg  of zofran, 500cc bolus of NS, and antibiotic prior to arrival. All of pts information sent with him including CD of CT.

## 2012-01-25 NOTE — ED Notes (Signed)
Patient transported to X-ray 

## 2012-01-26 ENCOUNTER — Encounter (HOSPITAL_COMMUNITY): Payer: Self-pay | Admitting: General Surgery

## 2012-01-26 MED ORDER — MUPIROCIN 2 % EX OINT
1.0000 "application " | TOPICAL_OINTMENT | Freq: Two times a day (BID) | CUTANEOUS | Status: DC
  Filled 2012-01-26: qty 22

## 2012-01-26 MED ORDER — CHLORHEXIDINE GLUCONATE CLOTH 2 % EX PADS: 6.0000 | MEDICATED_PAD | Freq: Every day | CUTANEOUS | Status: DC

## 2012-01-26 MED ORDER — OXYCODONE-ACETAMINOPHEN 5-325 MG PO TABS: 1.0000 | ORAL_TABLET | Freq: Four times a day (QID) | ORAL | Status: DC | PRN

## 2012-01-26 NOTE — Discharge Summary (Signed)
Patient ID: Troy Mack MRN: [id number] DOB/AGE: Aug 08, 1927 79 y.o.  Admit date: 01/25/2012 Discharge date: 01/26/2012  Procedures: laparoscopic cholecystectomy  Consults: None  Reason for Admission: This is an 79 year old gentleman who was transferred from [facility] emergency department where he presented with right upper quadrant abdominal pain. He has had intermittent right upper quadrant abdominal pain for approximately 2 weeks. He has had 2 episodes of emesis. The pain is now sharp and constant and moderate to severe. It does not refer anywhere else. He is otherwise without complaints. He denies chest pain or shortness of breath. Bowel movements are normal.  Admission Diagnoses:  1. Acute cholecystitis  Hospital Course: the patient was admitted and taken to the operating room where where he underwent a lap chole.  No IOC was able to be performed.  The patient tolerated the procedure well.  By POD# 1, his diet was able to be advanced and his pain was well controlled.  He was stable for dc home.  Discharge Diagnoses:  1. Acute cholecystitis, s/p lap chole  Discharge Medications:   Medication List     As of 01/26/2012  8:59 AM    TAKE these medications         multivitamin with minerals Tabs   Take 1 tablet by mouth daily.      oxyCODONE-acetaminophen 5-325 MG per tablet   Commonly known as: PERCOCET/ROXICET   Take 1-2 tablets by mouth every 6 (six) hours as needed (pain).        Discharge Instructions:     Follow-up Information    Follow up with [facility]. On 02/13/2012. (2:15pm, arrive at 1:45pm)    Contact information:   [address]   [place] [address] [phone]          Signed: Letha Cape 01/26/2012, 8:59 AM

## 2012-01-26 NOTE — Discharge Summary (Signed)
Laryn Venning, MD, MPH, FACS Pager: [phone]  

## 2012-02-13 ENCOUNTER — Ambulatory Visit (INDEPENDENT_AMBULATORY_CARE_PROVIDER_SITE_OTHER): Payer: Medicare Other | Admitting: General Surgery

## 2012-02-13 ENCOUNTER — Encounter (INDEPENDENT_AMBULATORY_CARE_PROVIDER_SITE_OTHER): Payer: Self-pay | Admitting: General Surgery

## 2012-02-13 ENCOUNTER — Encounter (INDEPENDENT_AMBULATORY_CARE_PROVIDER_SITE_OTHER): Payer: Medicare Other

## 2012-02-13 VITALS — BP 132/72 | HR 68 | Temp 98.2°F | Resp 18 | Ht 74.0 in | Wt 203.0 lb

## 2012-02-13 DIAGNOSIS — K8000 Calculus of gallbladder with acute cholecystitis without obstruction: Secondary | ICD-10-CM

## 2012-02-13 DIAGNOSIS — Z9889 Other specified postprocedural states: Secondary | ICD-10-CM

## 2012-02-13 NOTE — Patient Instructions (Signed)
Follow up as needed

## 2012-02-13 NOTE — Progress Notes (Signed)
[facility] November 30, 1927 [id number] 02/13/2012   Troy Mack is a 79 y.o. male who had a laparoscopic cholecystectomy with intraoperative cholangiogram by Dr. Lindie Spruce.  The pathology report confirmed acute suppurative cholecystitis with benign liver.  The patient reports that they are feeling well with normal bowel movements and good appetite.  The pre-operative symptoms of abdominal pain, nausea, and vomiting have resolved.    Physical examination - Incisions appear well-healed with no sign of infection or bleeding.   Abdomen - soft, non-tender  Impression:  s/p laparoscopic cholecystectomy  Plan:  He may resume a regular diet and full activity.  He may follow-up on a PRN basis.

## 2012-07-25 DIAGNOSIS — Z8546 Personal history of malignant neoplasm of prostate: Secondary | ICD-10-CM | POA: Insufficient documentation

## 2012-07-25 HISTORY — DX: Personal history of malignant neoplasm of prostate: Z85.46

## 2014-09-24 IMAGING — CR DG CHEST 2V
2 series · 2 of 2 positions shown · non-contrast
Comparison: PA and lateral chest 05/18/2008.

CLINICAL DATA: Abdominal pain and nausea.

CHEST - 2 VIEW

[w chest pa]
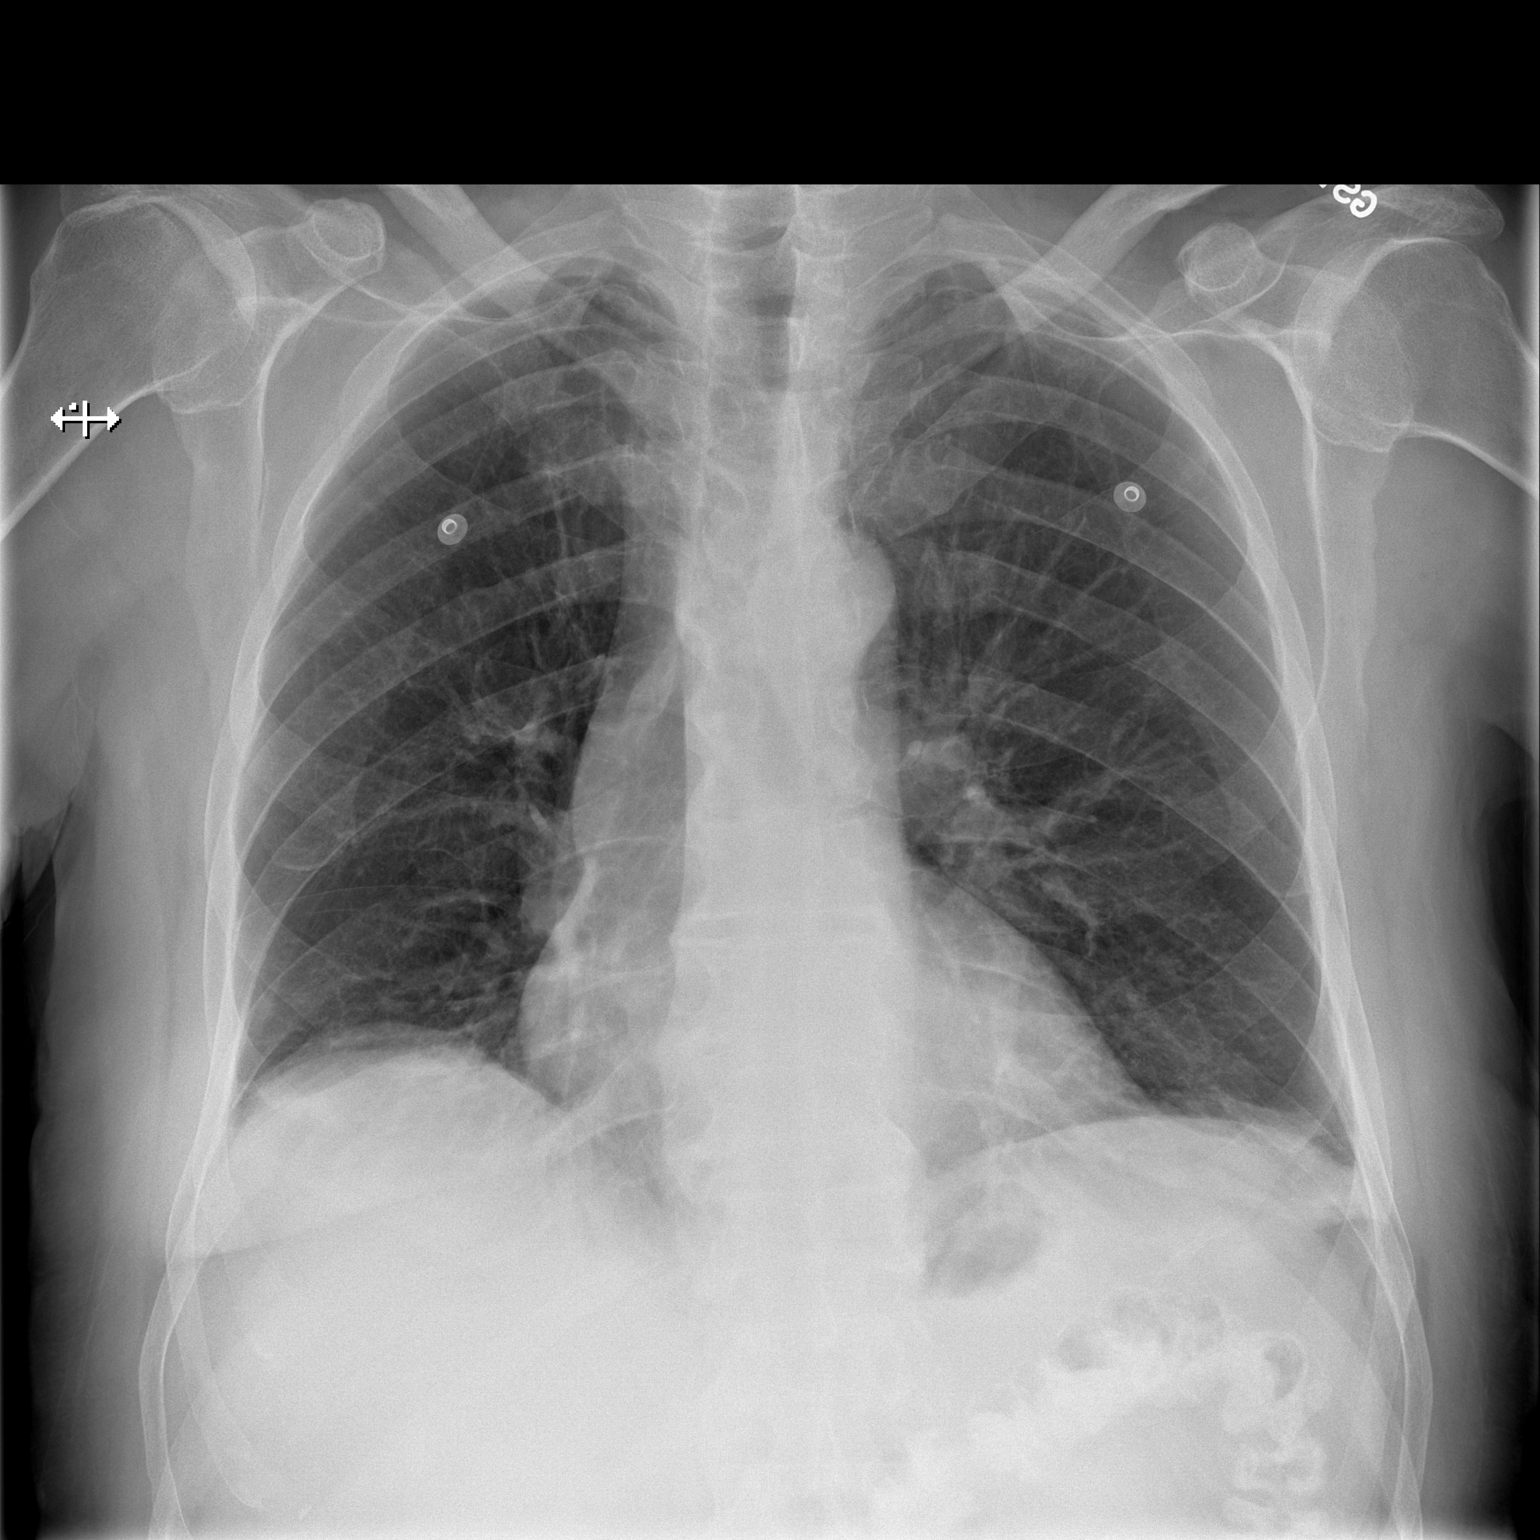

[w chest lat]
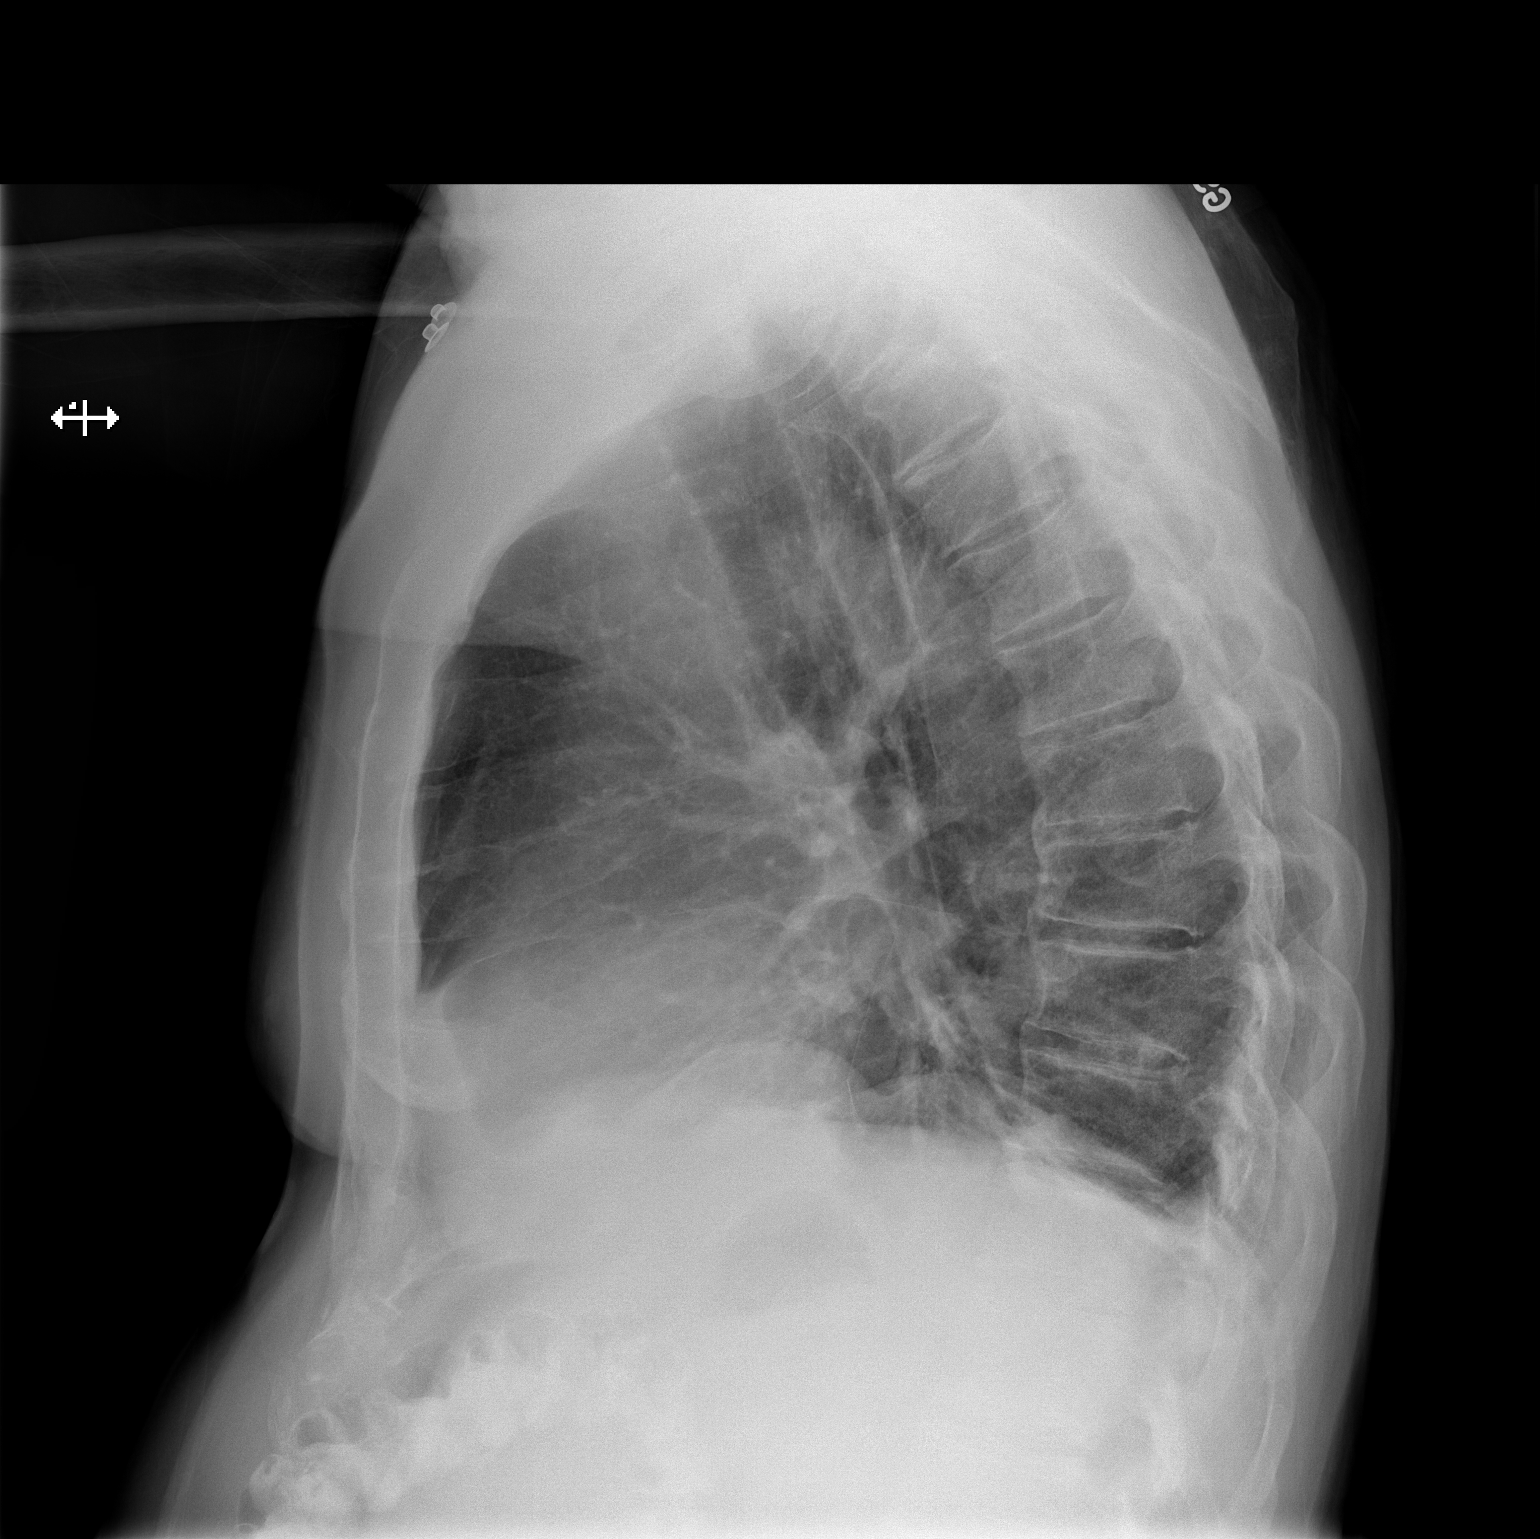

[2 of 2 positions shown; findings below may reference images not displayed]

FINDINGS: Linear atelectasis is seen in the lung bases.  Lungs
otherwise clear.  No pneumothorax or effusion.  Heart size upper
normal.
IMPRESSION: No acute finding.  Bibasilar atelectasis.

## 2015-09-16 DIAGNOSIS — D1800 Hemangioma unspecified site: Secondary | ICD-10-CM

## 2015-09-16 DIAGNOSIS — R002 Palpitations: Secondary | ICD-10-CM | POA: Insufficient documentation

## 2015-09-16 DIAGNOSIS — R5383 Other fatigue: Secondary | ICD-10-CM

## 2015-09-16 HISTORY — DX: Hemangioma unspecified site: D18.00

## 2015-09-16 HISTORY — DX: Palpitations: R00.2

## 2015-09-16 HISTORY — DX: Other fatigue: R53.83

## 2020-01-27 DIAGNOSIS — R69 Illness, unspecified: Secondary | ICD-10-CM | POA: Diagnosis not present

## 2020-01-27 DIAGNOSIS — I4891 Unspecified atrial fibrillation: Secondary | ICD-10-CM | POA: Diagnosis not present

## 2020-01-27 DIAGNOSIS — N390 Urinary tract infection, site not specified: Secondary | ICD-10-CM | POA: Diagnosis not present

## 2020-01-27 DIAGNOSIS — E785 Hyperlipidemia, unspecified: Secondary | ICD-10-CM | POA: Diagnosis not present

## 2020-01-27 DIAGNOSIS — H6123 Impacted cerumen, bilateral: Secondary | ICD-10-CM | POA: Diagnosis not present

## 2020-01-27 DIAGNOSIS — N183 Chronic kidney disease, stage 3 unspecified: Secondary | ICD-10-CM | POA: Diagnosis not present

## 2020-02-04 DIAGNOSIS — H6123 Impacted cerumen, bilateral: Secondary | ICD-10-CM | POA: Diagnosis not present

## 2020-03-17 DIAGNOSIS — R0600 Dyspnea, unspecified: Secondary | ICD-10-CM | POA: Diagnosis not present

## 2020-03-17 DIAGNOSIS — I4891 Unspecified atrial fibrillation: Secondary | ICD-10-CM | POA: Diagnosis not present

## 2020-03-19 DIAGNOSIS — I4891 Unspecified atrial fibrillation: Secondary | ICD-10-CM | POA: Diagnosis not present

## 2020-04-07 DIAGNOSIS — N200 Calculus of kidney: Secondary | ICD-10-CM | POA: Diagnosis not present

## 2020-04-07 DIAGNOSIS — N3021 Other chronic cystitis with hematuria: Secondary | ICD-10-CM | POA: Diagnosis not present

## 2020-04-07 DIAGNOSIS — N476 Balanoposthitis: Secondary | ICD-10-CM | POA: Diagnosis not present

## 2020-04-17 DIAGNOSIS — F329 Major depressive disorder, single episode, unspecified: Secondary | ICD-10-CM

## 2020-04-17 HISTORY — DX: Major depressive disorder, single episode, unspecified: F32.9

## 2020-04-21 DIAGNOSIS — E119 Type 2 diabetes mellitus without complications: Secondary | ICD-10-CM | POA: Diagnosis not present

## 2020-04-21 DIAGNOSIS — E785 Hyperlipidemia, unspecified: Secondary | ICD-10-CM | POA: Diagnosis not present

## 2020-04-21 DIAGNOSIS — N183 Chronic kidney disease, stage 3 unspecified: Secondary | ICD-10-CM | POA: Diagnosis not present

## 2020-04-21 DIAGNOSIS — R69 Illness, unspecified: Secondary | ICD-10-CM | POA: Diagnosis not present

## 2020-04-21 DIAGNOSIS — N390 Urinary tract infection, site not specified: Secondary | ICD-10-CM | POA: Diagnosis not present

## 2020-04-21 DIAGNOSIS — I4891 Unspecified atrial fibrillation: Secondary | ICD-10-CM | POA: Diagnosis not present

## 2020-06-05 DIAGNOSIS — D6869 Other thrombophilia: Secondary | ICD-10-CM | POA: Diagnosis not present

## 2020-06-05 DIAGNOSIS — Z7901 Long term (current) use of anticoagulants: Secondary | ICD-10-CM | POA: Diagnosis not present

## 2020-06-05 DIAGNOSIS — Z886 Allergy status to analgesic agent status: Secondary | ICD-10-CM | POA: Diagnosis not present

## 2020-06-05 DIAGNOSIS — Z8546 Personal history of malignant neoplasm of prostate: Secondary | ICD-10-CM | POA: Diagnosis not present

## 2020-06-05 DIAGNOSIS — Z008 Encounter for other general examination: Secondary | ICD-10-CM | POA: Diagnosis not present

## 2020-06-05 DIAGNOSIS — Z7984 Long term (current) use of oral hypoglycemic drugs: Secondary | ICD-10-CM | POA: Diagnosis not present

## 2020-06-05 DIAGNOSIS — E119 Type 2 diabetes mellitus without complications: Secondary | ICD-10-CM | POA: Diagnosis not present

## 2020-06-05 DIAGNOSIS — I4891 Unspecified atrial fibrillation: Secondary | ICD-10-CM | POA: Diagnosis not present

## 2020-06-29 DIAGNOSIS — B372 Candidiasis of skin and nail: Secondary | ICD-10-CM | POA: Diagnosis not present

## 2020-07-03 DIAGNOSIS — Z043 Encounter for examination and observation following other accident: Secondary | ICD-10-CM | POA: Diagnosis not present

## 2020-07-03 DIAGNOSIS — R531 Weakness: Secondary | ICD-10-CM | POA: Diagnosis not present

## 2020-07-03 DIAGNOSIS — Z7901 Long term (current) use of anticoagulants: Secondary | ICD-10-CM | POA: Diagnosis not present

## 2020-07-03 DIAGNOSIS — B952 Enterococcus as the cause of diseases classified elsewhere: Secondary | ICD-10-CM | POA: Diagnosis not present

## 2020-07-03 DIAGNOSIS — Z886 Allergy status to analgesic agent status: Secondary | ICD-10-CM | POA: Diagnosis not present

## 2020-07-03 DIAGNOSIS — N3000 Acute cystitis without hematuria: Secondary | ICD-10-CM | POA: Diagnosis not present

## 2020-07-03 DIAGNOSIS — M546 Pain in thoracic spine: Secondary | ICD-10-CM | POA: Diagnosis not present

## 2020-07-03 DIAGNOSIS — R7989 Other specified abnormal findings of blood chemistry: Secondary | ICD-10-CM

## 2020-07-03 DIAGNOSIS — R5383 Other fatigue: Secondary | ICD-10-CM | POA: Diagnosis not present

## 2020-07-03 DIAGNOSIS — I4891 Unspecified atrial fibrillation: Secondary | ICD-10-CM | POA: Diagnosis not present

## 2020-07-03 DIAGNOSIS — N183 Chronic kidney disease, stage 3 unspecified: Secondary | ICD-10-CM | POA: Diagnosis not present

## 2020-07-03 DIAGNOSIS — U071 COVID-19: Secondary | ICD-10-CM | POA: Diagnosis not present

## 2020-07-03 DIAGNOSIS — Z9181 History of falling: Secondary | ICD-10-CM | POA: Diagnosis not present

## 2020-07-03 DIAGNOSIS — B9620 Unspecified Escherichia coli [E. coli] as the cause of diseases classified elsewhere: Secondary | ICD-10-CM | POA: Diagnosis not present

## 2020-07-03 DIAGNOSIS — M545 Low back pain, unspecified: Secondary | ICD-10-CM | POA: Diagnosis not present

## 2020-07-03 DIAGNOSIS — J309 Allergic rhinitis, unspecified: Secondary | ICD-10-CM | POA: Diagnosis not present

## 2020-07-03 DIAGNOSIS — Z7982 Long term (current) use of aspirin: Secondary | ICD-10-CM | POA: Diagnosis not present

## 2020-07-03 DIAGNOSIS — H9190 Unspecified hearing loss, unspecified ear: Secondary | ICD-10-CM | POA: Diagnosis not present

## 2020-07-03 DIAGNOSIS — R410 Disorientation, unspecified: Secondary | ICD-10-CM | POA: Diagnosis not present

## 2020-07-03 DIAGNOSIS — N390 Urinary tract infection, site not specified: Secondary | ICD-10-CM | POA: Diagnosis not present

## 2020-07-03 DIAGNOSIS — J9811 Atelectasis: Secondary | ICD-10-CM | POA: Diagnosis not present

## 2020-07-03 DIAGNOSIS — Z87440 Personal history of urinary (tract) infections: Secondary | ICD-10-CM | POA: Diagnosis not present

## 2020-07-03 DIAGNOSIS — Z8546 Personal history of malignant neoplasm of prostate: Secondary | ICD-10-CM | POA: Diagnosis not present

## 2020-07-03 DIAGNOSIS — Z87442 Personal history of urinary calculi: Secondary | ICD-10-CM | POA: Diagnosis not present

## 2020-07-03 DIAGNOSIS — Z66 Do not resuscitate: Secondary | ICD-10-CM | POA: Diagnosis not present

## 2020-07-03 DIAGNOSIS — I700 Atherosclerosis of aorta: Secondary | ICD-10-CM | POA: Diagnosis not present

## 2020-07-03 HISTORY — DX: COVID-19: U07.1

## 2020-07-03 HISTORY — DX: Other specified abnormal findings of blood chemistry: R79.89

## 2020-07-04 DIAGNOSIS — I4891 Unspecified atrial fibrillation: Secondary | ICD-10-CM | POA: Diagnosis not present

## 2020-07-04 DIAGNOSIS — U071 COVID-19: Secondary | ICD-10-CM | POA: Diagnosis not present

## 2020-07-04 DIAGNOSIS — J309 Allergic rhinitis, unspecified: Secondary | ICD-10-CM | POA: Diagnosis not present

## 2020-07-04 DIAGNOSIS — R531 Weakness: Secondary | ICD-10-CM | POA: Diagnosis not present

## 2020-07-05 DIAGNOSIS — J309 Allergic rhinitis, unspecified: Secondary | ICD-10-CM | POA: Diagnosis not present

## 2020-07-05 DIAGNOSIS — U071 COVID-19: Secondary | ICD-10-CM | POA: Diagnosis not present

## 2020-07-05 DIAGNOSIS — R531 Weakness: Secondary | ICD-10-CM | POA: Diagnosis not present

## 2020-07-05 DIAGNOSIS — I4891 Unspecified atrial fibrillation: Secondary | ICD-10-CM | POA: Diagnosis not present

## 2020-07-06 DIAGNOSIS — J309 Allergic rhinitis, unspecified: Secondary | ICD-10-CM | POA: Diagnosis not present

## 2020-07-06 DIAGNOSIS — R748 Abnormal levels of other serum enzymes: Secondary | ICD-10-CM | POA: Diagnosis not present

## 2020-07-06 DIAGNOSIS — U071 COVID-19: Secondary | ICD-10-CM | POA: Diagnosis not present

## 2020-07-06 DIAGNOSIS — R531 Weakness: Secondary | ICD-10-CM | POA: Diagnosis not present

## 2020-07-06 DIAGNOSIS — I4891 Unspecified atrial fibrillation: Secondary | ICD-10-CM | POA: Diagnosis not present

## 2020-07-06 DIAGNOSIS — N183 Chronic kidney disease, stage 3 unspecified: Secondary | ICD-10-CM | POA: Diagnosis not present

## 2020-07-09 DIAGNOSIS — G319 Degenerative disease of nervous system, unspecified: Secondary | ICD-10-CM | POA: Diagnosis not present

## 2020-07-09 DIAGNOSIS — M47817 Spondylosis without myelopathy or radiculopathy, lumbosacral region: Secondary | ICD-10-CM | POA: Diagnosis not present

## 2020-07-09 DIAGNOSIS — N183 Chronic kidney disease, stage 3 unspecified: Secondary | ICD-10-CM | POA: Diagnosis not present

## 2020-07-09 DIAGNOSIS — U071 COVID-19: Secondary | ICD-10-CM | POA: Diagnosis not present

## 2020-07-09 DIAGNOSIS — B9620 Unspecified Escherichia coli [E. coli] as the cause of diseases classified elsewhere: Secondary | ICD-10-CM | POA: Diagnosis not present

## 2020-07-09 DIAGNOSIS — I4891 Unspecified atrial fibrillation: Secondary | ICD-10-CM | POA: Diagnosis not present

## 2020-07-09 DIAGNOSIS — Z7901 Long term (current) use of anticoagulants: Secondary | ICD-10-CM | POA: Diagnosis not present

## 2020-07-09 DIAGNOSIS — I700 Atherosclerosis of aorta: Secondary | ICD-10-CM | POA: Diagnosis not present

## 2020-07-09 DIAGNOSIS — Z9181 History of falling: Secondary | ICD-10-CM | POA: Diagnosis not present

## 2020-07-09 DIAGNOSIS — J9811 Atelectasis: Secondary | ICD-10-CM | POA: Diagnosis not present

## 2020-07-09 DIAGNOSIS — M47812 Spondylosis without myelopathy or radiculopathy, cervical region: Secondary | ICD-10-CM | POA: Diagnosis not present

## 2020-07-09 DIAGNOSIS — Z66 Do not resuscitate: Secondary | ICD-10-CM | POA: Diagnosis not present

## 2020-07-09 DIAGNOSIS — J309 Allergic rhinitis, unspecified: Secondary | ICD-10-CM | POA: Diagnosis not present

## 2020-07-09 DIAGNOSIS — B952 Enterococcus as the cause of diseases classified elsewhere: Secondary | ICD-10-CM | POA: Diagnosis not present

## 2020-07-09 DIAGNOSIS — Z602 Problems related to living alone: Secondary | ICD-10-CM | POA: Diagnosis not present

## 2020-07-09 DIAGNOSIS — N200 Calculus of kidney: Secondary | ICD-10-CM | POA: Diagnosis not present

## 2020-07-09 DIAGNOSIS — N390 Urinary tract infection, site not specified: Secondary | ICD-10-CM | POA: Diagnosis not present

## 2020-07-09 DIAGNOSIS — Z7984 Long term (current) use of oral hypoglycemic drugs: Secondary | ICD-10-CM | POA: Diagnosis not present

## 2020-07-09 DIAGNOSIS — C61 Malignant neoplasm of prostate: Secondary | ICD-10-CM | POA: Diagnosis not present

## 2020-07-09 DIAGNOSIS — M47816 Spondylosis without myelopathy or radiculopathy, lumbar region: Secondary | ICD-10-CM | POA: Diagnosis not present

## 2020-07-12 DIAGNOSIS — M47817 Spondylosis without myelopathy or radiculopathy, lumbosacral region: Secondary | ICD-10-CM | POA: Diagnosis not present

## 2020-07-12 DIAGNOSIS — J309 Allergic rhinitis, unspecified: Secondary | ICD-10-CM | POA: Diagnosis not present

## 2020-07-12 DIAGNOSIS — Z7984 Long term (current) use of oral hypoglycemic drugs: Secondary | ICD-10-CM | POA: Diagnosis not present

## 2020-07-12 DIAGNOSIS — C61 Malignant neoplasm of prostate: Secondary | ICD-10-CM | POA: Diagnosis not present

## 2020-07-12 DIAGNOSIS — N183 Chronic kidney disease, stage 3 unspecified: Secondary | ICD-10-CM | POA: Diagnosis not present

## 2020-07-12 DIAGNOSIS — M47816 Spondylosis without myelopathy or radiculopathy, lumbar region: Secondary | ICD-10-CM | POA: Diagnosis not present

## 2020-07-12 DIAGNOSIS — G319 Degenerative disease of nervous system, unspecified: Secondary | ICD-10-CM | POA: Diagnosis not present

## 2020-07-12 DIAGNOSIS — J9811 Atelectasis: Secondary | ICD-10-CM | POA: Diagnosis not present

## 2020-07-12 DIAGNOSIS — N200 Calculus of kidney: Secondary | ICD-10-CM | POA: Diagnosis not present

## 2020-07-12 DIAGNOSIS — B9620 Unspecified Escherichia coli [E. coli] as the cause of diseases classified elsewhere: Secondary | ICD-10-CM | POA: Diagnosis not present

## 2020-07-12 DIAGNOSIS — U071 COVID-19: Secondary | ICD-10-CM | POA: Diagnosis not present

## 2020-07-12 DIAGNOSIS — Z7901 Long term (current) use of anticoagulants: Secondary | ICD-10-CM | POA: Diagnosis not present

## 2020-07-12 DIAGNOSIS — I4891 Unspecified atrial fibrillation: Secondary | ICD-10-CM | POA: Diagnosis not present

## 2020-07-12 DIAGNOSIS — N390 Urinary tract infection, site not specified: Secondary | ICD-10-CM | POA: Diagnosis not present

## 2020-07-12 DIAGNOSIS — I700 Atherosclerosis of aorta: Secondary | ICD-10-CM | POA: Diagnosis not present

## 2020-07-12 DIAGNOSIS — Z602 Problems related to living alone: Secondary | ICD-10-CM | POA: Diagnosis not present

## 2020-07-12 DIAGNOSIS — M47812 Spondylosis without myelopathy or radiculopathy, cervical region: Secondary | ICD-10-CM | POA: Diagnosis not present

## 2020-07-12 DIAGNOSIS — Z66 Do not resuscitate: Secondary | ICD-10-CM | POA: Diagnosis not present

## 2020-07-12 DIAGNOSIS — B952 Enterococcus as the cause of diseases classified elsewhere: Secondary | ICD-10-CM | POA: Diagnosis not present

## 2020-07-12 DIAGNOSIS — Z9181 History of falling: Secondary | ICD-10-CM | POA: Diagnosis not present

## 2020-07-16 DIAGNOSIS — U071 COVID-19: Secondary | ICD-10-CM | POA: Diagnosis not present

## 2020-07-16 DIAGNOSIS — J9811 Atelectasis: Secondary | ICD-10-CM | POA: Diagnosis not present

## 2020-07-16 DIAGNOSIS — Z7984 Long term (current) use of oral hypoglycemic drugs: Secondary | ICD-10-CM | POA: Diagnosis not present

## 2020-07-16 DIAGNOSIS — N200 Calculus of kidney: Secondary | ICD-10-CM | POA: Diagnosis not present

## 2020-07-16 DIAGNOSIS — I4891 Unspecified atrial fibrillation: Secondary | ICD-10-CM | POA: Diagnosis not present

## 2020-07-16 DIAGNOSIS — C61 Malignant neoplasm of prostate: Secondary | ICD-10-CM | POA: Diagnosis not present

## 2020-07-16 DIAGNOSIS — Z602 Problems related to living alone: Secondary | ICD-10-CM | POA: Diagnosis not present

## 2020-07-16 DIAGNOSIS — M47812 Spondylosis without myelopathy or radiculopathy, cervical region: Secondary | ICD-10-CM | POA: Diagnosis not present

## 2020-07-16 DIAGNOSIS — I700 Atherosclerosis of aorta: Secondary | ICD-10-CM | POA: Diagnosis not present

## 2020-07-16 DIAGNOSIS — Z9181 History of falling: Secondary | ICD-10-CM | POA: Diagnosis not present

## 2020-07-16 DIAGNOSIS — B952 Enterococcus as the cause of diseases classified elsewhere: Secondary | ICD-10-CM | POA: Diagnosis not present

## 2020-07-16 DIAGNOSIS — N183 Chronic kidney disease, stage 3 unspecified: Secondary | ICD-10-CM | POA: Diagnosis not present

## 2020-07-16 DIAGNOSIS — Z7901 Long term (current) use of anticoagulants: Secondary | ICD-10-CM | POA: Diagnosis not present

## 2020-07-16 DIAGNOSIS — M47817 Spondylosis without myelopathy or radiculopathy, lumbosacral region: Secondary | ICD-10-CM | POA: Diagnosis not present

## 2020-07-16 DIAGNOSIS — Z66 Do not resuscitate: Secondary | ICD-10-CM | POA: Diagnosis not present

## 2020-07-16 DIAGNOSIS — J309 Allergic rhinitis, unspecified: Secondary | ICD-10-CM | POA: Diagnosis not present

## 2020-07-16 DIAGNOSIS — N390 Urinary tract infection, site not specified: Secondary | ICD-10-CM | POA: Diagnosis not present

## 2020-07-16 DIAGNOSIS — G319 Degenerative disease of nervous system, unspecified: Secondary | ICD-10-CM | POA: Diagnosis not present

## 2020-07-16 DIAGNOSIS — B9620 Unspecified Escherichia coli [E. coli] as the cause of diseases classified elsewhere: Secondary | ICD-10-CM | POA: Diagnosis not present

## 2020-07-16 DIAGNOSIS — M47816 Spondylosis without myelopathy or radiculopathy, lumbar region: Secondary | ICD-10-CM | POA: Diagnosis not present

## 2020-07-20 DIAGNOSIS — Z7984 Long term (current) use of oral hypoglycemic drugs: Secondary | ICD-10-CM | POA: Diagnosis not present

## 2020-07-20 DIAGNOSIS — M47812 Spondylosis without myelopathy or radiculopathy, cervical region: Secondary | ICD-10-CM | POA: Diagnosis not present

## 2020-07-20 DIAGNOSIS — N200 Calculus of kidney: Secondary | ICD-10-CM | POA: Diagnosis not present

## 2020-07-20 DIAGNOSIS — J309 Allergic rhinitis, unspecified: Secondary | ICD-10-CM | POA: Diagnosis not present

## 2020-07-20 DIAGNOSIS — Z602 Problems related to living alone: Secondary | ICD-10-CM | POA: Diagnosis not present

## 2020-07-20 DIAGNOSIS — Z9181 History of falling: Secondary | ICD-10-CM | POA: Diagnosis not present

## 2020-07-20 DIAGNOSIS — B9620 Unspecified Escherichia coli [E. coli] as the cause of diseases classified elsewhere: Secondary | ICD-10-CM | POA: Diagnosis not present

## 2020-07-20 DIAGNOSIS — J9811 Atelectasis: Secondary | ICD-10-CM | POA: Diagnosis not present

## 2020-07-20 DIAGNOSIS — I4891 Unspecified atrial fibrillation: Secondary | ICD-10-CM | POA: Diagnosis not present

## 2020-07-20 DIAGNOSIS — M47817 Spondylosis without myelopathy or radiculopathy, lumbosacral region: Secondary | ICD-10-CM | POA: Diagnosis not present

## 2020-07-20 DIAGNOSIS — I700 Atherosclerosis of aorta: Secondary | ICD-10-CM | POA: Diagnosis not present

## 2020-07-20 DIAGNOSIS — Z7901 Long term (current) use of anticoagulants: Secondary | ICD-10-CM | POA: Diagnosis not present

## 2020-07-20 DIAGNOSIS — B952 Enterococcus as the cause of diseases classified elsewhere: Secondary | ICD-10-CM | POA: Diagnosis not present

## 2020-07-20 DIAGNOSIS — N183 Chronic kidney disease, stage 3 unspecified: Secondary | ICD-10-CM | POA: Diagnosis not present

## 2020-07-20 DIAGNOSIS — U071 COVID-19: Secondary | ICD-10-CM | POA: Diagnosis not present

## 2020-07-20 DIAGNOSIS — Z66 Do not resuscitate: Secondary | ICD-10-CM | POA: Diagnosis not present

## 2020-07-20 DIAGNOSIS — N390 Urinary tract infection, site not specified: Secondary | ICD-10-CM | POA: Diagnosis not present

## 2020-07-20 DIAGNOSIS — G319 Degenerative disease of nervous system, unspecified: Secondary | ICD-10-CM | POA: Diagnosis not present

## 2020-07-20 DIAGNOSIS — C61 Malignant neoplasm of prostate: Secondary | ICD-10-CM | POA: Diagnosis not present

## 2020-07-20 DIAGNOSIS — M47816 Spondylosis without myelopathy or radiculopathy, lumbar region: Secondary | ICD-10-CM | POA: Diagnosis not present

## 2020-07-22 DIAGNOSIS — Z7901 Long term (current) use of anticoagulants: Secondary | ICD-10-CM | POA: Diagnosis not present

## 2020-07-22 DIAGNOSIS — Z9181 History of falling: Secondary | ICD-10-CM | POA: Diagnosis not present

## 2020-07-22 DIAGNOSIS — G319 Degenerative disease of nervous system, unspecified: Secondary | ICD-10-CM | POA: Diagnosis not present

## 2020-07-22 DIAGNOSIS — I700 Atherosclerosis of aorta: Secondary | ICD-10-CM | POA: Diagnosis not present

## 2020-07-22 DIAGNOSIS — J9811 Atelectasis: Secondary | ICD-10-CM | POA: Diagnosis not present

## 2020-07-22 DIAGNOSIS — B9620 Unspecified Escherichia coli [E. coli] as the cause of diseases classified elsewhere: Secondary | ICD-10-CM | POA: Diagnosis not present

## 2020-07-22 DIAGNOSIS — I4891 Unspecified atrial fibrillation: Secondary | ICD-10-CM | POA: Diagnosis not present

## 2020-07-22 DIAGNOSIS — R69 Illness, unspecified: Secondary | ICD-10-CM | POA: Diagnosis not present

## 2020-07-22 DIAGNOSIS — E119 Type 2 diabetes mellitus without complications: Secondary | ICD-10-CM | POA: Diagnosis not present

## 2020-07-22 DIAGNOSIS — E785 Hyperlipidemia, unspecified: Secondary | ICD-10-CM | POA: Diagnosis not present

## 2020-07-22 DIAGNOSIS — J309 Allergic rhinitis, unspecified: Secondary | ICD-10-CM | POA: Diagnosis not present

## 2020-07-22 DIAGNOSIS — N390 Urinary tract infection, site not specified: Secondary | ICD-10-CM | POA: Diagnosis not present

## 2020-07-22 DIAGNOSIS — U071 COVID-19: Secondary | ICD-10-CM | POA: Diagnosis not present

## 2020-07-22 DIAGNOSIS — Z602 Problems related to living alone: Secondary | ICD-10-CM | POA: Diagnosis not present

## 2020-07-22 DIAGNOSIS — N183 Chronic kidney disease, stage 3 unspecified: Secondary | ICD-10-CM | POA: Diagnosis not present

## 2020-07-22 DIAGNOSIS — B952 Enterococcus as the cause of diseases classified elsewhere: Secondary | ICD-10-CM | POA: Diagnosis not present

## 2020-07-22 DIAGNOSIS — M47817 Spondylosis without myelopathy or radiculopathy, lumbosacral region: Secondary | ICD-10-CM | POA: Diagnosis not present

## 2020-07-22 DIAGNOSIS — M47816 Spondylosis without myelopathy or radiculopathy, lumbar region: Secondary | ICD-10-CM | POA: Diagnosis not present

## 2020-07-22 DIAGNOSIS — N200 Calculus of kidney: Secondary | ICD-10-CM | POA: Diagnosis not present

## 2020-07-22 DIAGNOSIS — Z66 Do not resuscitate: Secondary | ICD-10-CM | POA: Diagnosis not present

## 2020-07-22 DIAGNOSIS — C61 Malignant neoplasm of prostate: Secondary | ICD-10-CM | POA: Diagnosis not present

## 2020-07-22 DIAGNOSIS — Z7984 Long term (current) use of oral hypoglycemic drugs: Secondary | ICD-10-CM | POA: Diagnosis not present

## 2020-07-22 DIAGNOSIS — M47812 Spondylosis without myelopathy or radiculopathy, cervical region: Secondary | ICD-10-CM | POA: Diagnosis not present

## 2020-07-23 DIAGNOSIS — N3021 Other chronic cystitis with hematuria: Secondary | ICD-10-CM | POA: Diagnosis not present

## 2020-07-23 DIAGNOSIS — R8271 Bacteriuria: Secondary | ICD-10-CM | POA: Diagnosis not present

## 2020-07-26 DIAGNOSIS — I4891 Unspecified atrial fibrillation: Secondary | ICD-10-CM | POA: Diagnosis not present

## 2020-07-26 DIAGNOSIS — M47817 Spondylosis without myelopathy or radiculopathy, lumbosacral region: Secondary | ICD-10-CM | POA: Diagnosis not present

## 2020-07-26 DIAGNOSIS — B952 Enterococcus as the cause of diseases classified elsewhere: Secondary | ICD-10-CM | POA: Diagnosis not present

## 2020-07-26 DIAGNOSIS — G319 Degenerative disease of nervous system, unspecified: Secondary | ICD-10-CM | POA: Diagnosis not present

## 2020-07-26 DIAGNOSIS — M47812 Spondylosis without myelopathy or radiculopathy, cervical region: Secondary | ICD-10-CM | POA: Diagnosis not present

## 2020-07-26 DIAGNOSIS — M47816 Spondylosis without myelopathy or radiculopathy, lumbar region: Secondary | ICD-10-CM | POA: Diagnosis not present

## 2020-07-26 DIAGNOSIS — U071 COVID-19: Secondary | ICD-10-CM | POA: Diagnosis not present

## 2020-07-26 DIAGNOSIS — N183 Chronic kidney disease, stage 3 unspecified: Secondary | ICD-10-CM | POA: Diagnosis not present

## 2020-07-26 DIAGNOSIS — I700 Atherosclerosis of aorta: Secondary | ICD-10-CM | POA: Diagnosis not present

## 2020-07-26 DIAGNOSIS — Z9181 History of falling: Secondary | ICD-10-CM | POA: Diagnosis not present

## 2020-07-26 DIAGNOSIS — Z66 Do not resuscitate: Secondary | ICD-10-CM | POA: Diagnosis not present

## 2020-07-26 DIAGNOSIS — Z7984 Long term (current) use of oral hypoglycemic drugs: Secondary | ICD-10-CM | POA: Diagnosis not present

## 2020-07-26 DIAGNOSIS — N200 Calculus of kidney: Secondary | ICD-10-CM | POA: Diagnosis not present

## 2020-07-26 DIAGNOSIS — B9620 Unspecified Escherichia coli [E. coli] as the cause of diseases classified elsewhere: Secondary | ICD-10-CM | POA: Diagnosis not present

## 2020-07-26 DIAGNOSIS — Z7901 Long term (current) use of anticoagulants: Secondary | ICD-10-CM | POA: Diagnosis not present

## 2020-07-26 DIAGNOSIS — J9811 Atelectasis: Secondary | ICD-10-CM | POA: Diagnosis not present

## 2020-07-26 DIAGNOSIS — N390 Urinary tract infection, site not specified: Secondary | ICD-10-CM | POA: Diagnosis not present

## 2020-07-26 DIAGNOSIS — J309 Allergic rhinitis, unspecified: Secondary | ICD-10-CM | POA: Diagnosis not present

## 2020-07-26 DIAGNOSIS — C61 Malignant neoplasm of prostate: Secondary | ICD-10-CM | POA: Diagnosis not present

## 2020-07-26 DIAGNOSIS — Z602 Problems related to living alone: Secondary | ICD-10-CM | POA: Diagnosis not present

## 2020-07-29 DIAGNOSIS — J9811 Atelectasis: Secondary | ICD-10-CM | POA: Diagnosis not present

## 2020-07-29 DIAGNOSIS — Z602 Problems related to living alone: Secondary | ICD-10-CM | POA: Diagnosis not present

## 2020-07-29 DIAGNOSIS — M47812 Spondylosis without myelopathy or radiculopathy, cervical region: Secondary | ICD-10-CM | POA: Diagnosis not present

## 2020-07-29 DIAGNOSIS — N390 Urinary tract infection, site not specified: Secondary | ICD-10-CM | POA: Diagnosis not present

## 2020-07-29 DIAGNOSIS — N183 Chronic kidney disease, stage 3 unspecified: Secondary | ICD-10-CM | POA: Diagnosis not present

## 2020-07-29 DIAGNOSIS — Z7901 Long term (current) use of anticoagulants: Secondary | ICD-10-CM | POA: Diagnosis not present

## 2020-07-29 DIAGNOSIS — Z9181 History of falling: Secondary | ICD-10-CM | POA: Diagnosis not present

## 2020-07-29 DIAGNOSIS — J309 Allergic rhinitis, unspecified: Secondary | ICD-10-CM | POA: Diagnosis not present

## 2020-07-29 DIAGNOSIS — I700 Atherosclerosis of aorta: Secondary | ICD-10-CM | POA: Diagnosis not present

## 2020-07-29 DIAGNOSIS — Z7984 Long term (current) use of oral hypoglycemic drugs: Secondary | ICD-10-CM | POA: Diagnosis not present

## 2020-07-29 DIAGNOSIS — U071 COVID-19: Secondary | ICD-10-CM | POA: Diagnosis not present

## 2020-07-29 DIAGNOSIS — I4891 Unspecified atrial fibrillation: Secondary | ICD-10-CM | POA: Diagnosis not present

## 2020-07-29 DIAGNOSIS — G319 Degenerative disease of nervous system, unspecified: Secondary | ICD-10-CM | POA: Diagnosis not present

## 2020-07-29 DIAGNOSIS — N200 Calculus of kidney: Secondary | ICD-10-CM | POA: Diagnosis not present

## 2020-07-29 DIAGNOSIS — B952 Enterococcus as the cause of diseases classified elsewhere: Secondary | ICD-10-CM | POA: Diagnosis not present

## 2020-07-29 DIAGNOSIS — Z66 Do not resuscitate: Secondary | ICD-10-CM | POA: Diagnosis not present

## 2020-07-29 DIAGNOSIS — M47817 Spondylosis without myelopathy or radiculopathy, lumbosacral region: Secondary | ICD-10-CM | POA: Diagnosis not present

## 2020-07-29 DIAGNOSIS — M47816 Spondylosis without myelopathy or radiculopathy, lumbar region: Secondary | ICD-10-CM | POA: Diagnosis not present

## 2020-07-29 DIAGNOSIS — C61 Malignant neoplasm of prostate: Secondary | ICD-10-CM | POA: Diagnosis not present

## 2020-07-29 DIAGNOSIS — B9620 Unspecified Escherichia coli [E. coli] as the cause of diseases classified elsewhere: Secondary | ICD-10-CM | POA: Diagnosis not present

## 2020-08-03 DIAGNOSIS — Z7984 Long term (current) use of oral hypoglycemic drugs: Secondary | ICD-10-CM | POA: Diagnosis not present

## 2020-08-03 DIAGNOSIS — Z66 Do not resuscitate: Secondary | ICD-10-CM | POA: Diagnosis not present

## 2020-08-03 DIAGNOSIS — M47817 Spondylosis without myelopathy or radiculopathy, lumbosacral region: Secondary | ICD-10-CM | POA: Diagnosis not present

## 2020-08-03 DIAGNOSIS — N390 Urinary tract infection, site not specified: Secondary | ICD-10-CM | POA: Diagnosis not present

## 2020-08-03 DIAGNOSIS — I4891 Unspecified atrial fibrillation: Secondary | ICD-10-CM | POA: Diagnosis not present

## 2020-08-03 DIAGNOSIS — J9811 Atelectasis: Secondary | ICD-10-CM | POA: Diagnosis not present

## 2020-08-03 DIAGNOSIS — Z9181 History of falling: Secondary | ICD-10-CM | POA: Diagnosis not present

## 2020-08-03 DIAGNOSIS — J309 Allergic rhinitis, unspecified: Secondary | ICD-10-CM | POA: Diagnosis not present

## 2020-08-03 DIAGNOSIS — I700 Atherosclerosis of aorta: Secondary | ICD-10-CM | POA: Diagnosis not present

## 2020-08-03 DIAGNOSIS — C61 Malignant neoplasm of prostate: Secondary | ICD-10-CM | POA: Diagnosis not present

## 2020-08-03 DIAGNOSIS — G319 Degenerative disease of nervous system, unspecified: Secondary | ICD-10-CM | POA: Diagnosis not present

## 2020-08-03 DIAGNOSIS — M47812 Spondylosis without myelopathy or radiculopathy, cervical region: Secondary | ICD-10-CM | POA: Diagnosis not present

## 2020-08-03 DIAGNOSIS — Z602 Problems related to living alone: Secondary | ICD-10-CM | POA: Diagnosis not present

## 2020-08-03 DIAGNOSIS — B9620 Unspecified Escherichia coli [E. coli] as the cause of diseases classified elsewhere: Secondary | ICD-10-CM | POA: Diagnosis not present

## 2020-08-03 DIAGNOSIS — N183 Chronic kidney disease, stage 3 unspecified: Secondary | ICD-10-CM | POA: Diagnosis not present

## 2020-08-03 DIAGNOSIS — B952 Enterococcus as the cause of diseases classified elsewhere: Secondary | ICD-10-CM | POA: Diagnosis not present

## 2020-08-03 DIAGNOSIS — M47816 Spondylosis without myelopathy or radiculopathy, lumbar region: Secondary | ICD-10-CM | POA: Diagnosis not present

## 2020-08-03 DIAGNOSIS — N200 Calculus of kidney: Secondary | ICD-10-CM | POA: Diagnosis not present

## 2020-08-03 DIAGNOSIS — U071 COVID-19: Secondary | ICD-10-CM | POA: Diagnosis not present

## 2020-08-03 DIAGNOSIS — Z7901 Long term (current) use of anticoagulants: Secondary | ICD-10-CM | POA: Diagnosis not present

## 2020-08-05 DIAGNOSIS — Z9181 History of falling: Secondary | ICD-10-CM | POA: Diagnosis not present

## 2020-08-05 DIAGNOSIS — J309 Allergic rhinitis, unspecified: Secondary | ICD-10-CM | POA: Diagnosis not present

## 2020-08-05 DIAGNOSIS — I700 Atherosclerosis of aorta: Secondary | ICD-10-CM | POA: Diagnosis not present

## 2020-08-05 DIAGNOSIS — N183 Chronic kidney disease, stage 3 unspecified: Secondary | ICD-10-CM | POA: Diagnosis not present

## 2020-08-05 DIAGNOSIS — Z602 Problems related to living alone: Secondary | ICD-10-CM | POA: Diagnosis not present

## 2020-08-05 DIAGNOSIS — C61 Malignant neoplasm of prostate: Secondary | ICD-10-CM | POA: Diagnosis not present

## 2020-08-05 DIAGNOSIS — I4891 Unspecified atrial fibrillation: Secondary | ICD-10-CM | POA: Diagnosis not present

## 2020-08-05 DIAGNOSIS — N390 Urinary tract infection, site not specified: Secondary | ICD-10-CM | POA: Diagnosis not present

## 2020-08-05 DIAGNOSIS — M47812 Spondylosis without myelopathy or radiculopathy, cervical region: Secondary | ICD-10-CM | POA: Diagnosis not present

## 2020-08-05 DIAGNOSIS — J9811 Atelectasis: Secondary | ICD-10-CM | POA: Diagnosis not present

## 2020-08-05 DIAGNOSIS — M47816 Spondylosis without myelopathy or radiculopathy, lumbar region: Secondary | ICD-10-CM | POA: Diagnosis not present

## 2020-08-05 DIAGNOSIS — U071 COVID-19: Secondary | ICD-10-CM | POA: Diagnosis not present

## 2020-08-05 DIAGNOSIS — B952 Enterococcus as the cause of diseases classified elsewhere: Secondary | ICD-10-CM | POA: Diagnosis not present

## 2020-08-05 DIAGNOSIS — Z66 Do not resuscitate: Secondary | ICD-10-CM | POA: Diagnosis not present

## 2020-08-05 DIAGNOSIS — M47817 Spondylosis without myelopathy or radiculopathy, lumbosacral region: Secondary | ICD-10-CM | POA: Diagnosis not present

## 2020-08-05 DIAGNOSIS — Z7901 Long term (current) use of anticoagulants: Secondary | ICD-10-CM | POA: Diagnosis not present

## 2020-08-05 DIAGNOSIS — Z7984 Long term (current) use of oral hypoglycemic drugs: Secondary | ICD-10-CM | POA: Diagnosis not present

## 2020-08-05 DIAGNOSIS — B9620 Unspecified Escherichia coli [E. coli] as the cause of diseases classified elsewhere: Secondary | ICD-10-CM | POA: Diagnosis not present

## 2020-08-05 DIAGNOSIS — N200 Calculus of kidney: Secondary | ICD-10-CM | POA: Diagnosis not present

## 2020-08-05 DIAGNOSIS — G319 Degenerative disease of nervous system, unspecified: Secondary | ICD-10-CM | POA: Diagnosis not present

## 2020-09-28 DIAGNOSIS — H5203 Hypermetropia, bilateral: Secondary | ICD-10-CM | POA: Diagnosis not present

## 2020-09-28 DIAGNOSIS — Z0100 Encounter for examination of eyes and vision without abnormal findings: Secondary | ICD-10-CM | POA: Diagnosis not present

## 2020-10-22 DIAGNOSIS — N200 Calculus of kidney: Secondary | ICD-10-CM | POA: Diagnosis not present

## 2020-10-22 DIAGNOSIS — N3021 Other chronic cystitis with hematuria: Secondary | ICD-10-CM | POA: Diagnosis not present

## 2020-10-22 DIAGNOSIS — Z8546 Personal history of malignant neoplasm of prostate: Secondary | ICD-10-CM | POA: Diagnosis not present

## 2020-10-22 DIAGNOSIS — R3915 Urgency of urination: Secondary | ICD-10-CM | POA: Diagnosis not present

## 2020-11-24 DIAGNOSIS — N390 Urinary tract infection, site not specified: Secondary | ICD-10-CM | POA: Insufficient documentation

## 2020-11-24 DIAGNOSIS — N183 Chronic kidney disease, stage 3 unspecified: Secondary | ICD-10-CM | POA: Diagnosis not present

## 2020-11-24 DIAGNOSIS — Z23 Encounter for immunization: Secondary | ICD-10-CM | POA: Diagnosis not present

## 2020-11-24 DIAGNOSIS — R0600 Dyspnea, unspecified: Secondary | ICD-10-CM | POA: Diagnosis not present

## 2020-11-24 DIAGNOSIS — I4891 Unspecified atrial fibrillation: Secondary | ICD-10-CM | POA: Diagnosis not present

## 2020-11-24 DIAGNOSIS — E119 Type 2 diabetes mellitus without complications: Secondary | ICD-10-CM | POA: Diagnosis not present

## 2020-11-24 DIAGNOSIS — E785 Hyperlipidemia, unspecified: Secondary | ICD-10-CM | POA: Diagnosis not present

## 2020-11-24 DIAGNOSIS — Z0000 Encounter for general adult medical examination without abnormal findings: Secondary | ICD-10-CM | POA: Diagnosis not present

## 2020-11-24 HISTORY — DX: Urinary tract infection, site not specified: N39.0

## 2021-01-18 DIAGNOSIS — M25559 Pain in unspecified hip: Secondary | ICD-10-CM

## 2021-01-18 DIAGNOSIS — R0789 Other chest pain: Secondary | ICD-10-CM

## 2021-01-18 DIAGNOSIS — W19XXXA Unspecified fall, initial encounter: Secondary | ICD-10-CM | POA: Diagnosis not present

## 2021-01-18 DIAGNOSIS — M1612 Unilateral primary osteoarthritis, left hip: Secondary | ICD-10-CM | POA: Diagnosis not present

## 2021-01-18 HISTORY — DX: Pain in unspecified hip: M25.559

## 2021-01-18 HISTORY — DX: Other chest pain: R07.89

## 2021-01-26 DIAGNOSIS — Z8546 Personal history of malignant neoplasm of prostate: Secondary | ICD-10-CM | POA: Diagnosis not present

## 2021-01-26 DIAGNOSIS — N3021 Other chronic cystitis with hematuria: Secondary | ICD-10-CM | POA: Diagnosis not present

## 2021-02-02 DIAGNOSIS — Z9049 Acquired absence of other specified parts of digestive tract: Secondary | ICD-10-CM | POA: Diagnosis not present

## 2021-02-02 DIAGNOSIS — N200 Calculus of kidney: Secondary | ICD-10-CM | POA: Diagnosis not present

## 2021-02-02 DIAGNOSIS — R1084 Generalized abdominal pain: Secondary | ICD-10-CM | POA: Diagnosis not present

## 2021-02-02 DIAGNOSIS — N281 Cyst of kidney, acquired: Secondary | ICD-10-CM | POA: Diagnosis not present

## 2021-02-02 DIAGNOSIS — Z8546 Personal history of malignant neoplasm of prostate: Secondary | ICD-10-CM | POA: Diagnosis not present

## 2021-02-02 DIAGNOSIS — N3021 Other chronic cystitis with hematuria: Secondary | ICD-10-CM | POA: Diagnosis not present

## 2021-03-02 DIAGNOSIS — N200 Calculus of kidney: Secondary | ICD-10-CM | POA: Diagnosis not present

## 2021-03-02 DIAGNOSIS — N3021 Other chronic cystitis with hematuria: Secondary | ICD-10-CM | POA: Diagnosis not present

## 2021-03-08 DIAGNOSIS — N3021 Other chronic cystitis with hematuria: Secondary | ICD-10-CM | POA: Diagnosis not present

## 2021-03-08 DIAGNOSIS — N200 Calculus of kidney: Secondary | ICD-10-CM | POA: Diagnosis not present

## 2021-03-09 ENCOUNTER — Other Ambulatory Visit: Payer: Self-pay | Admitting: Urology

## 2021-03-17 DIAGNOSIS — N3021 Other chronic cystitis with hematuria: Secondary | ICD-10-CM | POA: Diagnosis not present

## 2021-03-17 NOTE — Progress Notes (Signed)
03/17/2021 ?1:35 PM ?Left message to return call to review litho instructions. ?Kynzlee Hucker, Arville Lime ? ?

## 2021-03-18 ENCOUNTER — Encounter (HOSPITAL_BASED_OUTPATIENT_CLINIC_OR_DEPARTMENT_OTHER): Payer: Self-pay | Admitting: Urology

## 2021-03-18 NOTE — Progress Notes (Signed)
Left message for patient to return call for ESWL instructions. 

## 2021-03-18 NOTE — Progress Notes (Signed)
Patient has not returned calls. Reached daughter who is up to date on patients medications and medical history. She states he does not have dementia but states she has medical POA. Patients medical history has not been updated in Epic recently. Daughter reports some cardiac history "A-fib" in July of 2021 and SOB on exertion.There is no cardiac clearance or a recent EKG. Left message for Pam at [facility]. Patients daughter is calling his cardiologist. ?

## 2021-03-18 NOTE — Progress Notes (Signed)
Updated Pam at [facility] [employer] [employer] Patient will need cardiac clearance and an EKG. Patients daughter given information and will schedule appointment for her father. ESWL to be rescheduled. ?

## 2021-03-21 ENCOUNTER — Encounter (HOSPITAL_BASED_OUTPATIENT_CLINIC_OR_DEPARTMENT_OTHER): Admission: RE | Payer: Self-pay | Source: Home / Self Care

## 2021-03-21 ENCOUNTER — Other Ambulatory Visit: Payer: Self-pay | Admitting: Urology

## 2021-03-21 ENCOUNTER — Ambulatory Visit (HOSPITAL_BASED_OUTPATIENT_CLINIC_OR_DEPARTMENT_OTHER): Admission: RE | Admit: 2021-03-21 | Payer: Medicare HMO | Source: Home / Self Care | Admitting: Urology

## 2021-03-21 HISTORY — DX: Type 2 diabetes mellitus without complications: E11.9

## 2021-03-21 SURGERY — LITHOTRIPSY, ESWL
Anesthesia: LOCAL | Laterality: Left

## 2021-03-22 DIAGNOSIS — R0600 Dyspnea, unspecified: Secondary | ICD-10-CM | POA: Diagnosis not present

## 2021-03-23 DIAGNOSIS — I4891 Unspecified atrial fibrillation: Secondary | ICD-10-CM | POA: Diagnosis not present

## 2021-03-25 DIAGNOSIS — N200 Calculus of kidney: Secondary | ICD-10-CM | POA: Diagnosis not present

## 2021-03-28 ENCOUNTER — Other Ambulatory Visit: Payer: Self-pay | Admitting: Urology

## 2021-03-28 DIAGNOSIS — R0600 Dyspnea, unspecified: Secondary | ICD-10-CM | POA: Diagnosis not present

## 2021-03-28 DIAGNOSIS — Z01810 Encounter for preprocedural cardiovascular examination: Secondary | ICD-10-CM | POA: Diagnosis not present

## 2021-03-28 DIAGNOSIS — I4891 Unspecified atrial fibrillation: Secondary | ICD-10-CM | POA: Diagnosis not present

## 2021-03-29 ENCOUNTER — Other Ambulatory Visit: Payer: Self-pay | Admitting: Urology

## 2021-03-29 NOTE — Progress Notes (Signed)
Patient to arrive at [facility] on 03/31/2021. History and medications reviewed with patients daughter, Carlyon Shadow. Patient has seen his cardiologist for cardiac clearance. 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 Patient instructed to hold eliquis x 48 hours and metformin dose am of procedure. Pre-procedure instructions given. NPO after 0515 day of procedure except for clear liquids until 0715. Driver secured. ?

## 2021-03-30 NOTE — Anesthesia Preprocedure Evaluation (Addendum)
Anesthesia Evaluation  ?Patient identified by MRN, date of birth, ID band ?Patient awake ? ? ? ?Reviewed: ?Allergy & Precautions, NPO status , Patient's Chart, lab work & pertinent test results ? ?Airway ?Mallampati: II ? ?TM Distance: >3 FB ?Neck ROM: Full ? ? ? Dental ?no notable dental hx. ?(+) Upper Dentures, Lower Dentures ?  ?Pulmonary ?neg pulmonary ROS,  ?  ?Pulmonary exam normal ?breath sounds clear to auscultation ? ? ? ? ? ? Cardiovascular ?Exercise Tolerance: Good ?Normal cardiovascular exam+ dysrhythmias Atrial Fibrillation  ?Rhythm:Regular Rate:Normal ? ? ?  ?Neuro/Psych ?negative neurological ROS ? negative psych ROS  ? GI/Hepatic ?negative GI ROS, Neg liver ROS,   ?Endo/Other  ?diabetes, Type 2 ? Renal/GU ?Renal disease  ? ?  ?Musculoskeletal ?negative musculoskeletal ROS ?(+)  ? Abdominal ?  ?Peds ? Hematology ?  ?Anesthesia Other Findings ? ? Reproductive/Obstetrics ? ?  ? ? ? ? ? ? ? ? ? ? ? ? ? ?  ?  ? ? ? ? ? ? ?Anesthesia Physical ?Anesthesia Plan ? ?ASA: 3 ? ?Anesthesia Plan: MAC  ? ?Post-op Pain Management:   ? ?Induction: Intravenous ? ?PONV Risk Score and Plan: 1 and Treatment may vary due to age or medical condition and Ondansetron ? ?Airway Management Planned: Natural Airway and Nasal Cannula ? ?Additional Equipment: None ? ?Intra-op Plan:  ? ?Post-operative Plan:  ? ?Informed Consent: I have reviewed the patients History and Physical, chart, labs and discussed the procedure including the risks, benefits and alternatives for the proposed anesthesia with the patient or authorized representative who has indicated his/her understanding and acceptance.  ? ? ? ?Dental advisory given and History available from chart only ? ?Plan Discussed with:  ? ?Anesthesia Plan Comments:   ? ? ? ? ? ?Anesthesia Quick Evaluation ? ?

## 2021-03-31 ENCOUNTER — Ambulatory Visit (HOSPITAL_BASED_OUTPATIENT_CLINIC_OR_DEPARTMENT_OTHER)
Admission: RE | Admit: 2021-03-31 | Discharge: 2021-03-31 | Disposition: A | Discharge status: Home / Self Care | Payer: Medicare HMO | Attending: Urology | Admitting: Urology

## 2021-03-31 ENCOUNTER — Ambulatory Visit (HOSPITAL_BASED_OUTPATIENT_CLINIC_OR_DEPARTMENT_OTHER): Payer: Medicare HMO | Admitting: Certified Registered Nurse Anesthetist

## 2021-03-31 ENCOUNTER — Ambulatory Visit (HOSPITAL_COMMUNITY): Payer: Medicare HMO

## 2021-03-31 ENCOUNTER — Encounter (HOSPITAL_BASED_OUTPATIENT_CLINIC_OR_DEPARTMENT_OTHER)
Admission: RE | Disposition: A | Discharge status: Home / Self Care | Payer: Self-pay | Source: Home / Self Care | Attending: Urology

## 2021-03-31 ENCOUNTER — Encounter (HOSPITAL_BASED_OUTPATIENT_CLINIC_OR_DEPARTMENT_OTHER): Payer: Self-pay | Admitting: Urology

## 2021-03-31 DIAGNOSIS — I4891 Unspecified atrial fibrillation: Secondary | ICD-10-CM | POA: Diagnosis not present

## 2021-03-31 DIAGNOSIS — N200 Calculus of kidney: Secondary | ICD-10-CM

## 2021-03-31 DIAGNOSIS — Z01818 Encounter for other preprocedural examination: Secondary | ICD-10-CM | POA: Diagnosis not present

## 2021-03-31 DIAGNOSIS — E119 Type 2 diabetes mellitus without complications: Secondary | ICD-10-CM | POA: Diagnosis not present

## 2021-03-31 DIAGNOSIS — Z9049 Acquired absence of other specified parts of digestive tract: Secondary | ICD-10-CM | POA: Diagnosis not present

## 2021-03-31 HISTORY — PX: EXTRACORPOREAL SHOCK WAVE LITHOTRIPSY: SHX1557

## 2021-03-31 LAB — GLUCOSE, CAPILLARY
Glucose-Capillary: 128 mg/dL — ABNORMAL HIGH (ref 70–99)
Glucose-Capillary: 101 mg/dL — ABNORMAL HIGH (ref 70–99)

## 2021-03-31 SURGERY — LITHOTRIPSY, ESWL
Anesthesia: Monitor Anesthesia Care | Laterality: Left

## 2021-03-31 MED ORDER — FENTANYL CITRATE (PF) 100 MCG/2ML IJ SOLN
INTRAMUSCULAR | Status: AC
  Filled 2021-03-31: qty 2

## 2021-03-31 MED ORDER — PROPOFOL 1000 MG/100ML IV EMUL
INTRAVENOUS | Status: AC
  Filled 2021-03-31: qty 200

## 2021-03-31 MED ORDER — DIAZEPAM 5 MG PO TABS
10.0000 mg | ORAL_TABLET | ORAL | Status: DC
  Administered 2021-03-31: 2.5 mg via ORAL

## 2021-03-31 MED ORDER — LACTATED RINGERS IV SOLN
INTRAVENOUS | Status: DC | PRN
Start: 2021-03-31 — End: 2021-03-31

## 2021-03-31 MED ORDER — DIPHENHYDRAMINE HCL 12.5 MG/5ML PO ELIX
ORAL_SOLUTION | ORAL | Status: AC
  Filled 2021-03-31: qty 5

## 2021-03-31 MED ORDER — DIAZEPAM 5 MG PO TABS
ORAL_TABLET | ORAL | Status: AC
  Filled 2021-03-31: qty 1

## 2021-03-31 MED ORDER — DIPHENHYDRAMINE HCL 25 MG PO CAPS
25.0000 mg | ORAL_CAPSULE | ORAL | Status: AC
  Administered 2021-03-31: 12.5 mg via ORAL

## 2021-03-31 MED ORDER — SODIUM CHLORIDE 0.9 % IV SOLN: INTRAVENOUS | Status: DC

## 2021-03-31 MED ORDER — HYDROCODONE-ACETAMINOPHEN 5-325 MG PO TABS: 1.0000 | ORAL_TABLET | Freq: Four times a day (QID) | ORAL | 0 refills | Status: DC | PRN

## 2021-03-31 MED ORDER — TAMSULOSIN HCL 0.4 MG PO CAPS: 0.4000 mg | ORAL_CAPSULE | Freq: Every day | ORAL | 0 refills | Status: DC

## 2021-03-31 MED ORDER — DEXMEDETOMIDINE (PRECEDEX) IN NS 20 MCG/5ML (4 MCG/ML) IV SYRINGE
PREFILLED_SYRINGE | INTRAVENOUS | Status: AC
  Filled 2021-03-31: qty 5

## 2021-03-31 MED ORDER — ONDANSETRON HCL 4 MG/2ML IJ SOLN
INTRAMUSCULAR | Status: AC
  Filled 2021-03-31: qty 2

## 2021-03-31 MED ORDER — FENTANYL CITRATE (PF) 250 MCG/5ML IJ SOLN
INTRAMUSCULAR | Status: DC | PRN
Start: 2021-03-31 — End: 2021-03-31
  Administered 2021-03-31: 25 ug via INTRAVENOUS
  Administered 2021-03-31: 50 ug via INTRAVENOUS

## 2021-03-31 MED ORDER — CIPROFLOXACIN HCL 500 MG PO TABS
500.0000 mg | ORAL_TABLET | ORAL | Status: AC
  Administered 2021-03-31: 500 mg via ORAL

## 2021-03-31 MED ORDER — CIPROFLOXACIN HCL 500 MG PO TABS
ORAL_TABLET | ORAL | Status: AC
  Filled 2021-03-31: qty 1

## 2021-03-31 MED ORDER — MIDAZOLAM HCL 2 MG/2ML IJ SOLN
INTRAMUSCULAR | Status: AC
  Filled 2021-03-31: qty 2

## 2021-03-31 MED ORDER — PROPOFOL 500 MG/50ML IV EMUL
INTRAVENOUS | Status: DC | PRN
  Administered 2021-03-31: 80 ug/kg/min via INTRAVENOUS

## 2021-03-31 NOTE — H&P (Signed)
H&P ? ?History of Present Illness: Troy Mack is a 88 y.o. year old with a left renal stone causing some flank pain. He presents today for ESWL (left) ? ?Past Medical History:  ?Diagnosis Date  ? Cancer [facility])   ? Prostate May 2010 diagnosed  ? Diabetes mellitus without complication ([place])   ? Kidney stones   ? Macular degeneration   ? ? ?Past Surgical History:  ?Procedure Laterality Date  ? APPENDECTOMY    ? CHOLECYSTECTOMY  01/25/2012  ? Procedure: LAPAROSCOPIC CHOLECYSTECTOMY;  Surgeon: Zenovia Jarred, MD;  Location: [place];  Service: General;  Laterality: N/A;  ? ? ?Home Medications:  ?Current Meds  ?Medication Sig  ? cetirizine (ZYRTEC) 10 MG tablet Take 10 mg by mouth daily as needed for allergies.  ? metFORMIN (GLUCOPHAGE) 500 MG tablet Take 250 mg by mouth 2 (two) times daily.  ? ? ?Allergies:  ?Allergies  ?Allergen Reactions  ? Asa [Aspirin]   ?  Pt states he cannot take 324 ASA due to swelling of the mouth but can take baby ASA 81 mg  ? ? ?History reviewed. No pertinent family history. ? ?Social History:  reports that he has never smoked. He has never used smokeless tobacco. He reports that he does not drink alcohol and does not use drugs. ? ?ROS: ?A complete review of systems was performed.  All systems are negative except for pertinent findings as noted. ? ?Physical Exam:  ?Vital signs in last 24 hours: ?Temp:  [97 ?F (36.1 ?C)] 97 ?F (36.1 ?C) (03/16 1013) ?Resp:  [20] 20 (03/16 1013) ?BP: (160)/(78) 160/78 (03/16 1013) ?SpO2:  [99 %] 99 % (03/16 1013) ?Weight:  [98 kg] 98 kg (03/16 1013) ?Constitutional:  Alert and oriented, No acute distress ?Cardiovascular: Regular rate and rhythm, No JVD ?Respiratory: Normal respiratory effort, Lungs clear bilaterally ?GI: Abdomen is soft, nontender, nondistended, no abdominal masses ?GU: No CVA tenderness ?Lymphatic: No lymphadenopathy ?Neurologic: Grossly intact, no focal deficits ?Psychiatric: Normal mood and affect ? ? ?Laboratory Data:  ?No results for  input(s): WBC, HGB, HCT, PLT in the last 72 hours. ? ?No results for input(s): NA, K, CL, GLUCOSE, BUN, CALCIUM, CREATININE in the last 72 hours. ? ?Invalid input(s): CO3 ? ? ?Results for orders placed or performed during the hospital encounter of 03/31/21 (from the past 24 hour(s))  ?Glucose, capillary     Status: Abnormal  ? Collection Time: 03/31/21 10:19 AM  ?Result Value Ref Range  ? Glucose-Capillary 128 (H) 70 - 99 mg/dL  ? ?No results found for this or any previous visit (from the past 240 hour(s)). ? ?Renal Function: ?No results for input(s): CREATININE in the last 168 hours. ?CrCl cannot be calculated (Patient's most recent lab result is older than the maximum 21 days allowed.). ? ?Radiologic Imaging: ?No results found. ? ?Assessment:  ?88 yo M with left renal stone + flank pain ? ?Plan:  ?To OR for ESWL ? ?Donald Pore, MD ?03/31/2021, 10:26 AM  ?[facility] ?Pager: [phone] ? ? ?

## 2021-03-31 NOTE — Addendum Note (Signed)
Addendum  created 03/31/21 1444 by Clearnce Sorrel, CRNA  ? Attestation recorded in [place], [employer] filed  ?  ?

## 2021-03-31 NOTE — Discharge Instructions (Addendum)
1. You should strain your urine and collect all fragments and bring them to your follow up appointment.  2. You should take your pain medication as needed.  Please call if your pain is severe to the point that it is not controlled with your pain medication. 3. You should call if you develop fever > 101 or persistent nausea or vomiting. 4. Your doctor may prescribe tamsulosin to take to help facilitate stone passage.    Post Anesthesia Home Care Instructions  Activity: Get plenty of rest for the remainder of the day. A responsible individual must stay with you for 24 hours following the procedure.  For the next 24 hours, DO NOT: -Drive a car -Operate machinery -Drink alcoholic beverages -Take any medication unless instructed by your physician -Make any legal decisions or sign important papers.  Meals: Start with liquid foods such as gelatin or soup. Progress to regular foods as tolerated. Avoid greasy, spicy, heavy foods. If nausea and/or vomiting occur, drink only clear liquids until the nausea and/or vomiting subsides. Call your physician if vomiting continues.  Special Instructions/Symptoms: Your throat may feel dry or sore from the anesthesia or the breathing tube placed in your throat during surgery. If this causes discomfort, gargle with warm salt water. The discomfort should disappear within 24 hours.  

## 2021-03-31 NOTE — Op Note (Signed)
See [employer] operative note scanned into chart. Also because of the size, density, location and other factors that cannot be anticipated I feel this will likely be a staged procedure. This fact supersedes any indication in the scanned [place] stone operative note to the contrary. ? ?Donald Pore MD ?03/31/2021, 12:59 PM  ?[facility]  ?Pager: [phone] ? ?

## 2021-03-31 NOTE — Anesthesia Postprocedure Evaluation (Signed)
Anesthesia Post Note ? ?Patient: Troy Mack ? ?Procedure(s) Performed: LEFT EXTRACORPOREAL SHOCK WAVE LITHOTRIPSY (ESWL) (Left) ? ?  ? ?Patient location during evaluation: PACU ?Anesthesia Type: MAC ?Level of consciousness: awake and alert ?Pain management: pain level controlled ?Vital Signs Assessment: post-procedure vital signs reviewed and stable ?Respiratory status: spontaneous breathing, nonlabored ventilation, respiratory function stable and patient connected to nasal cannula oxygen ?Cardiovascular status: blood pressure returned to baseline and stable ?Postop Assessment: no apparent nausea or vomiting ?Anesthetic complications: no ? ? ?No notable events documented. ? ?Last Vitals:  ?Vitals:  ? 03/31/21 1013 03/31/21 1330  ?BP: (!) 160/78 (!) 166/90  ?Pulse:  (!) 57  ?Resp: 20 14  ?Temp: (!) 36.1 ?C (!) 36.3 ?C  ?SpO2: 99% 98%  ?  ?Last Pain:  ?Vitals:  ? 03/31/21 1330  ?PainSc: 1   ? ? ?  ?  ?  ?  ?  ?  ? ?[place] ? ? ? ? ?

## 2021-03-31 NOTE — Transfer of Care (Signed)
Immediate Anesthesia Transfer of Care Note ? ?Patient: Montario Zilka ? ?Procedure(s) Performed: Procedure(s) (LRB): ?LEFT EXTRACORPOREAL SHOCK WAVE LITHOTRIPSY (ESWL) (Left) ? ?Patient Location: PACU ? ?Anesthesia Type: MAC ? ?Level of Consciousness: awake, alert , oriented and patient cooperative ? ?Airway & Oxygen Therapy: Patient Spontanous Breathing on room air ? ?Post-op Assessment: Report given to PACU RN and Post -op Vital signs reviewed and stable ? ?Post vital signs: Reviewed and stable ? ?Complications: No apparent anesthesia complications ? ?Last Vitals:  ?Vitals Value Taken Time  ?BP    ?Temp    ?Pulse    ?Resp    ?SpO2    ? ? ?Last Pain:  ?Vitals:  ? 03/31/21 1330  ?PainSc: 1   ?   ? ?Patients Stated Pain Goal: 4 (03/31/21 1330) ? ?Complications: No notable events documented. ?

## 2021-04-01 ENCOUNTER — Encounter (HOSPITAL_BASED_OUTPATIENT_CLINIC_OR_DEPARTMENT_OTHER): Payer: Self-pay | Admitting: Urology

## 2021-04-13 ENCOUNTER — Telehealth: Payer: Self-pay

## 2021-04-13 NOTE — Telephone Encounter (Signed)
NOTES FROM LISA HALL PA-C ?

## 2021-04-14 DIAGNOSIS — N200 Calculus of kidney: Secondary | ICD-10-CM | POA: Diagnosis not present

## 2021-05-03 DIAGNOSIS — N200 Calculus of kidney: Secondary | ICD-10-CM | POA: Diagnosis not present

## 2021-05-19 DIAGNOSIS — I2510 Atherosclerotic heart disease of native coronary artery without angina pectoris: Secondary | ICD-10-CM | POA: Diagnosis not present

## 2021-05-19 DIAGNOSIS — I471 Supraventricular tachycardia: Secondary | ICD-10-CM | POA: Diagnosis not present

## 2021-05-19 DIAGNOSIS — G3184 Mild cognitive impairment, so stated: Secondary | ICD-10-CM | POA: Diagnosis not present

## 2021-05-19 DIAGNOSIS — E1151 Type 2 diabetes mellitus with diabetic peripheral angiopathy without gangrene: Secondary | ICD-10-CM | POA: Diagnosis not present

## 2021-05-19 DIAGNOSIS — I4891 Unspecified atrial fibrillation: Secondary | ICD-10-CM | POA: Diagnosis not present

## 2021-05-19 DIAGNOSIS — I700 Atherosclerosis of aorta: Secondary | ICD-10-CM | POA: Diagnosis not present

## 2021-05-19 DIAGNOSIS — E1122 Type 2 diabetes mellitus with diabetic chronic kidney disease: Secondary | ICD-10-CM | POA: Diagnosis not present

## 2021-05-19 DIAGNOSIS — E1159 Type 2 diabetes mellitus with other circulatory complications: Secondary | ICD-10-CM | POA: Diagnosis not present

## 2021-05-19 DIAGNOSIS — K219 Gastro-esophageal reflux disease without esophagitis: Secondary | ICD-10-CM | POA: Diagnosis not present

## 2021-05-19 DIAGNOSIS — E669 Obesity, unspecified: Secondary | ICD-10-CM | POA: Diagnosis not present

## 2021-05-19 DIAGNOSIS — D6869 Other thrombophilia: Secondary | ICD-10-CM | POA: Diagnosis not present

## 2021-05-19 DIAGNOSIS — D6859 Other primary thrombophilia: Secondary | ICD-10-CM | POA: Diagnosis not present

## 2021-06-06 NOTE — Progress Notes (Signed)
Cardiology Office Note:    Date:  06/07/2021   ID:  Troy Mack, DOB 01/28/1927, MRN [id number]  PCP:  Pcp, No  Cardiologist:  None   Referring MD: Littie Deeds, PA-C   Chief Complaint  Patient presents with   Atrial Fibrillation    History of Present Illness:    Troy Mack is a 88 y.o. male with a hx of DM II, negative nuclear stress test 2023, hyperlipidemia, and atrial fibrillation to establish care.   Patient has no complaints.  Atrial fibs diagnosed in 2021.  The story is that he was asymptomatic, visited his primary physician, she listened and felt he was in atrial fibs and immediately referred him to a cardiologist.  He saw the cardiologist, Dr. Lajuan Lines, who started Eliquis and has followed him since that time.  The patient has never had any symptomatology associated with his heart.  He does have dyspnea on exertion.  He has never had stroke or any complications of atrial fibrillation.  When the diagnosis was made, he was feeling well and has never had palpitations or any suggestion of a problem.  Past Medical History:  Diagnosis Date   Cancer [facility])    Prostate May 2010 diagnosed   Diabetes mellitus without complication ([place])    Kidney stones    Macular degeneration     Past Surgical History:  Procedure Laterality Date   APPENDECTOMY     CHOLECYSTECTOMY  01/25/2012   Procedure: LAPAROSCOPIC CHOLECYSTECTOMY;  Surgeon: Zenovia Jarred, MD;  Location: [place];  Service: General;  Laterality: N/A;   EXTRACORPOREAL SHOCK WAVE LITHOTRIPSY Left 03/31/2021   Procedure: LEFT EXTRACORPOREAL SHOCK WAVE LITHOTRIPSY (ESWL);  Surgeon: Vira Agar, MD;  Location[facility];  Service: Urology;  Laterality: Left;    Current Medications: Current Meds  Medication Sig   acetaminophen (TYLENOL) 325 MG tablet Take 2 tablets by mouth as needed.   apixaban (ELIQUIS) 5 MG TABS tablet Take 5 mg by mouth 2 (two) times daily.   cetirizine (ZYRTEC) 10 MG tablet Take  10 mg by mouth daily as needed for allergies.   Cinnamon 500 MG capsule Take 1 capsule by mouth in the morning and at bedtime.   Cranberry (CRANBEREX PO) Take 4,200 mg by mouth daily.   metFORMIN (GLUCOPHAGE) 500 MG tablet Take 250 mg by mouth 2 (two) times daily.   Multiple Vitamin (MULTIVITAMIN WITH MINERALS) TABS Take 1 tablet by mouth daily.   nystatin (MYCOSTATIN/NYSTOP) powder as needed.   Turmeric 500 MG CAPS Take 1 capsule by mouth daily.   VITAMIN D PO Take 125 mcg by mouth daily.     Allergies:   Asa [aspirin]   Social History   Socioeconomic History   Marital status: Widowed    Spouse name: Not on file   Number of children: Not on file   Years of education: Not on file   Highest education level: Not on file  Occupational History   Not on file  Tobacco Use   Smoking status: Never   Smokeless tobacco: Never  Substance and Sexual Activity   Alcohol use: No   Drug use: No   Sexual activity: Not Currently  Other Topics Concern   Not on file  Social History Narrative   Not on file   Social Determinants of Health   Financial Resource Strain: Not on file  Food Insecurity: Not on file  Transportation Needs: Not on file  Physical Activity: Not on file  Stress: Not  on file  Social Connections: Not on file     Family History: The patient's family history is not on file.  ROS:   Please see the history of present illness.    Denies orthopnea, PND, lower extremity swelling, claudication, and transient neurological symptoms.  All other systems reviewed and are negative.  EKGs/Labs/Other Studies Reviewed:    The following studies were reviewed today:  Rest stress myocardial perfusion imaging March 2023: There is no definite scintigraphic evidence of Lexiscan induced transient myocardial perfusion defect/ischemia. There is no definite scintigraphic evidence of fixed myocardial perfusion defect/scar. Normal left ventricular size with normal regional and global  systolic function and ejection fraction of 55%. Normal right ventricular systolic function. No attenuation artifact. Technical quality of study was good Compared to September 01, 2019, no significant change Signed by Grover Canavan MD    EKG:  EKG sinus rhythm, left anterior hemiblock, nonspecific T wave abnormality, poor R wave progression V1 through V5.  No prior tracings are available for comparison.  Recent Labs: No results found for requested labs within last 8760 hours.  Recent Lipid Panel No results found for: CHOL, TRIG, HDL, CHOLHDL, VLDL, LDLCALC, LDLDIRECT  Physical Exam:    VS:  BP 120/60   Pulse 67   Ht '6\' 2"'$  (1.88 m)   Wt 217 lb (98.4 kg)   SpO2 95%   BMI 27.86 kg/m     Wt Readings from Last 3 Encounters:  06/07/21 217 lb (98.4 kg)  03/31/21 216 lb (98 kg)  02/13/12 203 lb (92.1 kg)     GEN: Slightly frail appearing but great for age.. No acute distress HEENT: Normal NECK: No JVD. LYMPHATICS: No lymphadenopathy CARDIAC: No murmur.  Irregular RR no gallop, or edema. VASCULAR:  Normal Pulses. No bruits. RESPIRATORY:  Clear to auscultation without rales, wheezing or rhonchi  ABDOMEN: Soft, non-tender, non-distended, No pulsatile mass, MUSCULOSKELETAL: No deformity  SKIN: Warm and dry NEUROLOGIC:  Alert and oriented x 3 PSYCHIATRIC:  Normal affect   ASSESSMENT:    1. Paroxysmal atrial fibrillation (HCC)   2. Chronic anticoagulation   3. Controlled type 2 diabetes mellitus without complication, without long-term current use of insulin ([place])   4. Primary hypertension   5. Hyperlipidemia, unspecified hyperlipidemia type    PLAN:    In order of problems listed above:  Though the algorithm on our EKG machine makes the diagnosis of atrial fibrillation, the patient clearly has sinus rhythm with some PACs.  We need data documenting atrial fibrillation.  We need to exclude the possibility that he had a wandering atrial pacemaker or some other rhythm that mimicked  atrial fibs.  We will send for the EKGs and Holter monitor results from Dr. Lajuan Lines in [place].  Discontinue turmeric. Needs to be set up with primary care physician. Continue to follow-up blood pressure.  Not currently on therapy although hypertension is listed on his medication list. Not currently on a statin or other agent for lipid lowering.  9 to 92-monthfollow-up.   Medication Adjustments/Labs and Tests Ordered: Current medicines are reviewed at length with the patient today.  Concerns regarding medicines are outlined above.  No orders of the defined types were placed in this encounter.  No orders of the defined types were placed in this encounter.   There are no Patient Instructions on file for this visit.   Signed, HSinclair Grooms MD  06/07/2021 11:52 AM    [place]

## 2021-06-07 ENCOUNTER — Ambulatory Visit: Payer: Medicare HMO | Admitting: Interventional Cardiology

## 2021-06-07 ENCOUNTER — Encounter: Payer: Self-pay | Admitting: Interventional Cardiology

## 2021-06-07 VITALS — BP 120/60 | HR 67 | Ht 74.0 in | Wt 217.0 lb

## 2021-06-07 DIAGNOSIS — E119 Type 2 diabetes mellitus without complications: Secondary | ICD-10-CM

## 2021-06-07 DIAGNOSIS — Z7901 Long term (current) use of anticoagulants: Secondary | ICD-10-CM

## 2021-06-07 DIAGNOSIS — I480 Paroxysmal atrial fibrillation: Secondary | ICD-10-CM

## 2021-06-07 DIAGNOSIS — I10 Essential (primary) hypertension: Secondary | ICD-10-CM | POA: Diagnosis not present

## 2021-06-07 DIAGNOSIS — I4821 Permanent atrial fibrillation: Secondary | ICD-10-CM

## 2021-06-07 DIAGNOSIS — E785 Hyperlipidemia, unspecified: Secondary | ICD-10-CM

## 2021-06-07 NOTE — Patient Instructions (Signed)
Medication Instructions:  Your physician has recommended you make the following change in your medication:   1) STOP Turmeric  *If you need a refill on your cardiac medications before your next appointment, please call your pharmacy*  Lab Work: NONE  Testing/Procedures: NONE  Follow-Up: At [facility], you and your health needs are our priority.  As part of our continuing mission to provide you with exceptional heart care, we have created designated Provider Care Teams.  These Care Teams include your primary Cardiologist (physician) and Advanced Practice Providers (APPs -  Physician Assistants and Nurse Practitioners) who all work together to provide you with the care you need, when you need it.  Your next appointment:   9-12 month(s)  The format for your next appointment:   In Person  Provider:   Sinclair Grooms, MD {    Important Information About Sugar

## 2021-06-28 ENCOUNTER — Other Ambulatory Visit: Payer: Self-pay

## 2021-06-28 MED ORDER — APIXABAN 5 MG PO TABS: 5.0000 mg | ORAL_TABLET | Freq: Two times a day (BID) | ORAL | 1 refills | Status: DC

## 2021-06-28 NOTE — Telephone Encounter (Signed)
Pt's daughter calling requesting a 90 days supply of Eliquis be sent to pt's pharmacy. Daughter would like a call back concerning this matter. Please address

## 2021-06-28 NOTE — Telephone Encounter (Signed)
Prescription refill request for Eliquis received. Indication: PAF Last office visit: 06/07/21  Troy Millers MD Scr: 1.20 on 07/06/20 Age:  88 Weight: 98.4kg  Based on above findings Eliquis '5mg'$  twice daily is the appropriate dose.  Refill approved and daughter notified.

## 2021-08-01 DIAGNOSIS — I4891 Unspecified atrial fibrillation: Secondary | ICD-10-CM | POA: Diagnosis not present

## 2021-08-01 DIAGNOSIS — N1832 Chronic kidney disease, stage 3b: Secondary | ICD-10-CM | POA: Diagnosis not present

## 2021-08-01 DIAGNOSIS — Z6827 Body mass index (BMI) 27.0-27.9, adult: Secondary | ICD-10-CM | POA: Diagnosis not present

## 2021-08-01 DIAGNOSIS — E663 Overweight: Secondary | ICD-10-CM | POA: Diagnosis not present

## 2021-08-01 DIAGNOSIS — E119 Type 2 diabetes mellitus without complications: Secondary | ICD-10-CM | POA: Diagnosis not present

## 2021-08-01 DIAGNOSIS — Z7901 Long term (current) use of anticoagulants: Secondary | ICD-10-CM | POA: Diagnosis not present

## 2021-08-08 DIAGNOSIS — N200 Calculus of kidney: Secondary | ICD-10-CM | POA: Diagnosis not present

## 2021-08-08 DIAGNOSIS — N3021 Other chronic cystitis with hematuria: Secondary | ICD-10-CM | POA: Diagnosis not present

## 2021-09-30 DIAGNOSIS — H5203 Hypermetropia, bilateral: Secondary | ICD-10-CM | POA: Diagnosis not present

## 2022-02-06 DIAGNOSIS — Z7984 Long term (current) use of oral hypoglycemic drugs: Secondary | ICD-10-CM | POA: Diagnosis not present

## 2022-02-06 DIAGNOSIS — D6869 Other thrombophilia: Secondary | ICD-10-CM | POA: Diagnosis not present

## 2022-02-06 DIAGNOSIS — R030 Elevated blood-pressure reading, without diagnosis of hypertension: Secondary | ICD-10-CM | POA: Diagnosis not present

## 2022-02-06 DIAGNOSIS — R269 Unspecified abnormalities of gait and mobility: Secondary | ICD-10-CM | POA: Diagnosis not present

## 2022-02-06 DIAGNOSIS — J309 Allergic rhinitis, unspecified: Secondary | ICD-10-CM | POA: Diagnosis not present

## 2022-02-06 DIAGNOSIS — E785 Hyperlipidemia, unspecified: Secondary | ICD-10-CM | POA: Diagnosis not present

## 2022-02-06 DIAGNOSIS — E119 Type 2 diabetes mellitus without complications: Secondary | ICD-10-CM | POA: Diagnosis not present

## 2022-02-06 DIAGNOSIS — Z8546 Personal history of malignant neoplasm of prostate: Secondary | ICD-10-CM | POA: Diagnosis not present

## 2022-02-06 DIAGNOSIS — Z7901 Long term (current) use of anticoagulants: Secondary | ICD-10-CM | POA: Diagnosis not present

## 2022-02-06 DIAGNOSIS — Z833 Family history of diabetes mellitus: Secondary | ICD-10-CM | POA: Diagnosis not present

## 2022-02-06 DIAGNOSIS — I4891 Unspecified atrial fibrillation: Secondary | ICD-10-CM | POA: Diagnosis not present

## 2022-02-06 DIAGNOSIS — B372 Candidiasis of skin and nail: Secondary | ICD-10-CM | POA: Diagnosis not present

## 2022-02-13 DIAGNOSIS — Z6827 Body mass index (BMI) 27.0-27.9, adult: Secondary | ICD-10-CM | POA: Diagnosis not present

## 2022-02-13 DIAGNOSIS — F0390 Unspecified dementia without behavioral disturbance: Secondary | ICD-10-CM | POA: Diagnosis not present

## 2022-02-13 DIAGNOSIS — E1142 Type 2 diabetes mellitus with diabetic polyneuropathy: Secondary | ICD-10-CM | POA: Diagnosis not present

## 2022-02-13 DIAGNOSIS — I4891 Unspecified atrial fibrillation: Secondary | ICD-10-CM | POA: Diagnosis not present

## 2022-02-13 DIAGNOSIS — N1832 Chronic kidney disease, stage 3b: Secondary | ICD-10-CM | POA: Diagnosis not present

## 2022-02-13 DIAGNOSIS — Z8546 Personal history of malignant neoplasm of prostate: Secondary | ICD-10-CM | POA: Diagnosis not present

## 2022-02-13 DIAGNOSIS — R252 Cramp and spasm: Secondary | ICD-10-CM | POA: Diagnosis not present

## 2022-02-13 DIAGNOSIS — R69 Illness, unspecified: Secondary | ICD-10-CM | POA: Diagnosis not present

## 2022-02-13 DIAGNOSIS — Z23 Encounter for immunization: Secondary | ICD-10-CM | POA: Diagnosis not present

## 2022-02-13 DIAGNOSIS — Z7901 Long term (current) use of anticoagulants: Secondary | ICD-10-CM | POA: Diagnosis not present

## 2022-02-13 DIAGNOSIS — E663 Overweight: Secondary | ICD-10-CM | POA: Diagnosis not present

## 2022-02-14 ENCOUNTER — Other Ambulatory Visit: Payer: Self-pay

## 2022-02-14 DIAGNOSIS — C61 Malignant neoplasm of prostate: Secondary | ICD-10-CM | POA: Insufficient documentation

## 2022-02-14 DIAGNOSIS — E119 Type 2 diabetes mellitus without complications: Secondary | ICD-10-CM | POA: Insufficient documentation

## 2022-02-14 DIAGNOSIS — H3530 Unspecified macular degeneration: Secondary | ICD-10-CM | POA: Insufficient documentation

## 2022-02-14 DIAGNOSIS — Z6827 Body mass index (BMI) 27.0-27.9, adult: Secondary | ICD-10-CM | POA: Insufficient documentation

## 2022-02-14 DIAGNOSIS — C801 Malignant (primary) neoplasm, unspecified: Secondary | ICD-10-CM | POA: Insufficient documentation

## 2022-02-14 DIAGNOSIS — E1142 Type 2 diabetes mellitus with diabetic polyneuropathy: Secondary | ICD-10-CM | POA: Insufficient documentation

## 2022-02-14 DIAGNOSIS — N1832 Chronic kidney disease, stage 3b: Secondary | ICD-10-CM | POA: Insufficient documentation

## 2022-02-14 DIAGNOSIS — N200 Calculus of kidney: Secondary | ICD-10-CM | POA: Insufficient documentation

## 2022-02-14 DIAGNOSIS — I480 Paroxysmal atrial fibrillation: Secondary | ICD-10-CM | POA: Insufficient documentation

## 2022-02-14 DIAGNOSIS — I4891 Unspecified atrial fibrillation: Secondary | ICD-10-CM | POA: Insufficient documentation

## 2022-02-14 DIAGNOSIS — E785 Hyperlipidemia, unspecified: Secondary | ICD-10-CM | POA: Insufficient documentation

## 2022-02-14 HISTORY — DX: Paroxysmal atrial fibrillation: I48.0

## 2022-02-20 DIAGNOSIS — N3021 Other chronic cystitis with hematuria: Secondary | ICD-10-CM | POA: Diagnosis not present

## 2022-02-20 DIAGNOSIS — N200 Calculus of kidney: Secondary | ICD-10-CM | POA: Diagnosis not present

## 2022-02-20 DIAGNOSIS — Z8546 Personal history of malignant neoplasm of prostate: Secondary | ICD-10-CM | POA: Diagnosis not present

## 2022-03-07 ENCOUNTER — Emergency Department (HOSPITAL_COMMUNITY)
Admission: EM | Admit: 2022-03-07 | Discharge: 2022-03-07 | Disposition: A | Discharge status: Home / Self Care | Payer: Medicare HMO | Attending: Emergency Medicine | Admitting: Emergency Medicine

## 2022-03-07 ENCOUNTER — Encounter (HOSPITAL_COMMUNITY): Payer: Self-pay

## 2022-03-07 ENCOUNTER — Emergency Department (HOSPITAL_COMMUNITY): Payer: Medicare HMO

## 2022-03-07 DIAGNOSIS — N281 Cyst of kidney, acquired: Secondary | ICD-10-CM | POA: Diagnosis not present

## 2022-03-07 DIAGNOSIS — N200 Calculus of kidney: Secondary | ICD-10-CM | POA: Insufficient documentation

## 2022-03-07 DIAGNOSIS — K5730 Diverticulosis of large intestine without perforation or abscess without bleeding: Secondary | ICD-10-CM | POA: Diagnosis not present

## 2022-03-07 DIAGNOSIS — I700 Atherosclerosis of aorta: Secondary | ICD-10-CM | POA: Insufficient documentation

## 2022-03-07 DIAGNOSIS — R300 Dysuria: Secondary | ICD-10-CM

## 2022-03-07 DIAGNOSIS — K7290 Hepatic failure, unspecified without coma: Secondary | ICD-10-CM | POA: Diagnosis not present

## 2022-03-07 LAB — URINALYSIS, ROUTINE W REFLEX MICROSCOPIC
Bilirubin Urine: NEGATIVE
Glucose, UA: NEGATIVE mg/dL
Hgb urine dipstick: NEGATIVE
Ketones, ur: 5 mg/dL — AB
Leukocytes,Ua: NEGATIVE
Nitrite: NEGATIVE
Protein, ur: 30 mg/dL — AB
Specific Gravity, Urine: 1.019 (ref 1.005–1.030)
pH: 5 (ref 5.0–8.0)

## 2022-03-07 LAB — CBC WITH DIFFERENTIAL/PLATELET
Abs Immature Granulocytes: 0.05 10*3/uL (ref 0.00–0.07)
Basophils Absolute: 0.1 10*3/uL (ref 0.0–0.1)
Basophils Relative: 1 %
Eosinophils Absolute: 0 10*3/uL (ref 0.0–0.5)
Eosinophils Relative: 0 %
HCT: 43.1 % (ref 39.0–52.0)
Hemoglobin: 14.5 g/dL (ref 13.0–17.0)
Immature Granulocytes: 1 %
Lymphocytes Relative: 8 %
Lymphs Abs: 0.7 10*3/uL (ref 0.7–4.0)
MCH: 31.7 pg (ref 26.0–34.0)
MCHC: 33.6 g/dL (ref 30.0–36.0)
MCV: 94.3 fL (ref 80.0–100.0)
Monocytes Absolute: 1 10*3/uL (ref 0.1–1.0)
Monocytes Relative: 11 %
Neutro Abs: 7.3 10*3/uL (ref 1.7–7.7)
Neutrophils Relative %: 79 %
Platelets: 201 10*3/uL (ref 150–400)
RBC: 4.57 MIL/uL (ref 4.22–5.81)
RDW: 12.7 % (ref 11.5–15.5)
WBC: 9.1 10*3/uL (ref 4.0–10.5)
nRBC: 0 % (ref 0.0–0.2)

## 2022-03-07 LAB — BASIC METABOLIC PANEL
Anion gap: 10 (ref 5–15)
BUN: 21 mg/dL (ref 8–23)
CO2: 24 mmol/L (ref 22–32)
Calcium: 9.8 mg/dL (ref 8.9–10.3)
Chloride: 102 mmol/L (ref 98–111)
Creatinine, Ser: 1.57 mg/dL — ABNORMAL HIGH (ref 0.61–1.24)
GFR, Estimated: 41 mL/min — ABNORMAL LOW (ref >60)
Glucose, Bld: 161 mg/dL — ABNORMAL HIGH (ref 70–99)
Potassium: 4.1 mmol/L (ref 3.5–5.1)
Sodium: 136 mmol/L (ref 135–145)

## 2022-03-07 NOTE — Discharge Instructions (Signed)
Your creatinine today is 1.57.  Follow-up with your doctor for this.  Drink plenty of IV fluids.  Follow-up with your urologist for any issues related to your urine stream

## 2022-03-07 NOTE — ED Triage Notes (Signed)
Pt was recently diagnosed with a UTI and given antibiotics. Family at bedside report that he wasn't feeling well yesterday and seemed to be more fatigued than normal. Pt c/o flank pain as well as burning with urination.

## 2022-03-07 NOTE — ED Provider Notes (Signed)
Troy Mack AT [facility] Provider Note   CSN: [id number] Arrival date & time: 03/07/22  1202     History  Chief Complaint  Patient presents with   Burning with urination   Fatigue    Troy Mack is a 89 y.o. male.  89 year old male present with right flank pain along with dysuria.  Treated for UTI recently with amoxicillin for 7 days.  Has completed that course.  Continues to endorse urinary symptoms.  Notes some weakness.  Fever up to 101.  Denies any vomiting or diarrhea.  Called his doctor and told to come here       Home Medications Prior to Admission medications   Medication Sig Start Date End Date Taking? Authorizing Provider  acetaminophen (TYLENOL) 325 MG tablet Take 2 tablets by mouth as needed for mild pain or moderate pain. 07/06/20   [provider]  apixaban (ELIQUIS) 5 MG TABS tablet Take 1 tablet (5 mg total) by mouth 2 (two) times daily. 06/28/21   Belva Crome, MD  atorvastatin (LIPITOR) 20 MG tablet Take 20 mg by mouth daily. 10/28/21   [provider]  cetirizine (ZYRTEC) 10 MG tablet Take 10 mg by mouth daily as needed for allergies.    [provider]  Cinnamon 500 MG capsule Take 1 capsule by mouth in the morning and at bedtime.    [provider]  Cranberry (CRANBEREX PO) Take 4,200 mg by mouth daily.    [provider]  donepezil (ARICEPT) 5 MG tablet Take 5 mg by mouth daily. 02/14/22   [provider]  metFORMIN (GLUCOPHAGE) 500 MG tablet Take 250 mg by mouth 2 (two) times daily.    [provider]  Multiple Vitamin (MULTIVITAMIN WITH MINERALS) TABS Take 1 tablet by mouth daily.    [provider]  nystatin (MYCOSTATIN/NYSTOP) powder Apply 1 Application topically as needed (rash). 05/04/21   [provider]  VITAMIN D PO Take 125 mcg by mouth daily.    [provider]      Allergies    Asa [aspirin]    Review of Systems    Review of Systems  All other systems reviewed and are negative.   Physical Exam Updated Vital Signs BP 118/71 (BP Location: Left Arm)   Pulse 91   Temp 98.2 F (36.8 C) (Oral)   Resp 20   SpO2 96%  Physical Exam Vitals and nursing note reviewed.  Constitutional:      General: He is not in acute distress.    Appearance: Normal appearance. He is well-developed. He is not toxic-appearing.  HENT:     Head: Normocephalic and atraumatic.  Eyes:     General: Lids are normal.     Conjunctiva/sclera: Conjunctivae normal.     Pupils: Pupils are equal, round, and reactive to light.  Neck:     Thyroid: No thyroid mass.     Trachea: No tracheal deviation.  Cardiovascular:     Rate and Rhythm: Normal rate and regular rhythm.     Heart sounds: Normal heart sounds. No murmur heard.    No gallop.  Pulmonary:     Effort: Pulmonary effort is normal. No respiratory distress.     Breath sounds: Normal breath sounds. No stridor. No decreased breath sounds, wheezing, rhonchi or rales.  Abdominal:     General: There is no distension.     Palpations: Abdomen is soft.     Tenderness: There is no abdominal tenderness.  There is right CVA tenderness. There is no rebound.  Genitourinary:    Penis: Uncircumcised. No phimosis, paraphimosis, erythema, tenderness, swelling or lesions.   Musculoskeletal:        General: No tenderness. Normal range of motion.     Cervical back: Normal range of motion and neck supple.  Skin:    General: Skin is warm and dry.     Findings: No abrasion or rash.  Neurological:     Mental Status: He is alert and oriented to person, place, and time. Mental status is at baseline.     GCS: GCS eye subscore is 4. GCS verbal subscore is 5. GCS motor subscore is 6.     Cranial Nerves: No cranial nerve deficit.     Sensory: No sensory deficit.     Motor: Motor function is intact.  Psychiatric:        Attention and Perception: Attention normal.        Speech: Speech normal.         Behavior: Behavior normal.     ED Results / Procedures / Treatments   Labs (all labs ordered are listed, but only abnormal results are displayed) Labs Reviewed  BASIC METABOLIC PANEL - Abnormal; Notable for the following components:      Result Value   Glucose, Bld 161 (*)    Creatinine, Ser 1.57 (*)    GFR, Estimated 41 (*)    All other components within normal limits  CBC WITH DIFFERENTIAL/PLATELET  URINALYSIS, ROUTINE W REFLEX MICROSCOPIC    EKG None  Radiology No results found.  Procedures Procedures    Medications Ordered in ED Medications - No data to display  ED Course/ Medical Decision Making/ A&P                             Medical Decision Making Amount and/or Complexity of Data Reviewed Labs: ordered. Radiology: ordered.   Patient presented with dysuria concern for possible UTI.  Urinalysis here showed no evidence of infection.  Patient's electrolytes significant for slightly increased creatinine.  Patient had a renal CT for evaluation of possible kidney stone as patient has history of such.  Versus pyelonephritis.  Patient's renal CT per my review interpretation shows no ureteral stones.  Patient had possible early diverticulitis on CT but clinically has no left lower quadrant abdominal pain.  No bowel changes.  Feel less likely.  Plan will be for patient to orally hydrate at home and follow-up with his doctor.        Final Clinical Impression(s) / ED Diagnoses Final diagnoses:  None    Rx / DC Orders ED Discharge Orders     None         Lacretia Leigh, MD 03/07/22 1435

## 2022-03-17 ENCOUNTER — Ambulatory Visit: Payer: Medicare HMO | Admitting: Physician Assistant

## 2022-03-20 ENCOUNTER — Ambulatory Visit: Payer: Medicare HMO | Attending: Cardiology | Admitting: Cardiology

## 2022-03-20 ENCOUNTER — Encounter: Payer: Self-pay | Admitting: Cardiology

## 2022-03-20 VITALS — BP 138/68 | HR 70 | Ht 74.0 in | Wt 213.4 lb

## 2022-03-20 DIAGNOSIS — E782 Mixed hyperlipidemia: Secondary | ICD-10-CM

## 2022-03-20 DIAGNOSIS — N1832 Chronic kidney disease, stage 3b: Secondary | ICD-10-CM | POA: Diagnosis not present

## 2022-03-20 DIAGNOSIS — E1142 Type 2 diabetes mellitus with diabetic polyneuropathy: Secondary | ICD-10-CM | POA: Diagnosis not present

## 2022-03-20 DIAGNOSIS — I480 Paroxysmal atrial fibrillation: Secondary | ICD-10-CM

## 2022-03-20 MED ORDER — APIXABAN 2.5 MG PO TABS: 2.5000 mg | ORAL_TABLET | Freq: Two times a day (BID) | ORAL | 3 refills | Status: DC

## 2022-03-20 NOTE — Progress Notes (Signed)
Cardiology Office Note:    Date:  03/20/2022   ID:  Troy Mack, DOB June 21, 1927, MRN [id number]  PCP:  Troy Right, MD  Cardiologist:  Troy Lindau, MD   Referring MD: Troy Right, MD    ASSESSMENT:    1. Paroxysmal atrial fibrillation (HCC)   2. Type 2 diabetes mellitus with polyneuropathy (HCC)   3. Mixed hyperlipidemia   4. Chronic renal failure, stage 3b (HCC)    PLAN:    In order of problems listed above:  Primary prevention stressed with the patient.  Importance of compliance with diet medication stressed any vocalized understanding. Paroxysmal atrial fibrillation:I discussed with the patient atrial fibrillation, disease process. Management and therapy including rate and rhythm control, anticoagulation benefits and potential risks were discussed extensively with the patient. Patient had multiple questions which were answered to patient's satisfaction. Diabetes mellitus: Managed by primary care.  Diet emphasized. Essential hypertension: Blood pressure stable and diet was emphasized. Renal insufficiency: Significant.  I will reduce dose of Eliquis. Patient will be seen in follow-up appointment in 6 months or earlier if the patient has any concerns    Medication Adjustments/Labs and Tests Ordered: Current medicines are reviewed at length with the patient today.  Concerns regarding medicines are outlined above.  No orders of the defined types were placed in this encounter.  No orders of the defined types were placed in this encounter.    No chief complaint on file.    History of Present Illness:    Troy Mack is a 89 y.o. male.  Patient has past medical history of paroxysmal atrial fibrillation.  This is the first time I am seeing him.  In the past he saw cardiologist in [place] who did a monitoring on him and told him and his daughter that he has atrial fibrillation, paroxysmal in nature and initiated him on Eliquis.  He has done fine since.  No  chest pain orthopnea or PND.  He is a poor historian.  He is accompanied by his daughter.  He is gait is stable and he has not had any falls.  At the time of my evaluation, the patient is alert awake oriented and in no distress.  Past Medical History:  Diagnosis Date   Atrial fibrillation ([place])    BMI 27.0-27.9,adult    Cancer [facility])    Prostate May 2010 diagnosed   Chest wall pain 01/18/2021   Chronic renal failure, stage 3b (HCC)    Chronic urinary tract infection 11/24/2020   COVID-19 07/03/2020   Diabetes mellitus without complication ([place])    Fatigue 09/16/2015   Hemangioma 09/16/2015   Hip pain 01/18/2021   Hyperlipidemia    Kidney stones    Macular degeneration    Palpitations 09/16/2015   Personal history of malignant neoplasm of prostate 07/25/2012   Formatting of this note might be different from the original. S/p radiation seed implants ~ 2010. Followed by Dr. Jeffie Mack, [place].   Prostate cancer ([place])    Reactive depression (situational) 04/17/2020   Renal stones    Serum creatinine raised 07/03/2020   1.'25mg'$ /dl (09/15/16--stable from 1.'22mg'$ /dl in 2017)   Type 2 diabetes mellitus ([place])    Type 2 diabetes mellitus with polyneuropathy [name])     Past Surgical History:  Procedure Laterality Date   APPENDECTOMY     CHOLECYSTECTOMY  01/25/2012   Procedure: LAPAROSCOPIC CHOLECYSTECTOMY;  Surgeon: Zenovia Jarred, MD;  Location: [place];  Service: General;  Laterality: N/A;   [place] LITHOTRIPSY  Left 03/31/2021   Procedure: LEFT EXTRACORPOREAL SHOCK WAVE LITHOTRIPSY (ESWL);  Surgeon: Troy Agar, MD;  Location: [facility];  Service: Urology;  Laterality: Left;    Current Medications: Current Meds  Medication Sig   acetaminophen (TYLENOL) 325 MG tablet Take 2 tablets by mouth as needed for mild pain or moderate pain.   apixaban (ELIQUIS) 5 MG TABS tablet Take 1 tablet (5 mg total) by mouth 2 (two) times daily.   atorvastatin (LIPITOR)  20 MG tablet Take 20 mg by mouth daily.   cetirizine (ZYRTEC) 10 MG tablet Take 10 mg by mouth daily as needed for allergies.   Cranberry (CRANBEREX PO) Take 4,200 mg by mouth daily.   donepezil (ARICEPT) 5 MG tablet Take 5 mg by mouth daily.   Magnesium 250 MG TABS Take 250 mg by mouth daily.   metFORMIN (GLUCOPHAGE) 500 MG tablet Take 250 mg by mouth 2 (two) times daily.   Multiple Vitamin (MULTIVITAMIN WITH MINERALS) TABS Take 1 tablet by mouth daily.   multivitamin-lutein (OCUVITE-LUTEIN) CAPS capsule Take 1 capsule by mouth 2 (two) times daily.   nystatin (MYCOSTATIN/NYSTOP) powder Apply 1 Application topically as needed (rash).   VITAMIN D PO Take 125 mcg by mouth daily.     Allergies:   Asa [aspirin]   Social History   Socioeconomic History   Marital status: Widowed    Spouse name: Not on file   Number of children: Not on file   Years of education: Not on file   Highest education level: Not on file  Occupational History   Not on file  Tobacco Use   Smoking status: Never   Smokeless tobacco: Never  Substance and Sexual Activity   Alcohol use: No   Drug use: No   Sexual activity: Not Currently  Other Topics Concern   Not on file  Social History Narrative   Not on file   Social Determinants of Health   Financial Resource Strain: Not on file  Food Insecurity: Not on file  Transportation Needs: Not on file  Physical Activity: Not on file  Stress: Not on file  Social Connections: Not on file     Family History: The patient's family history is negative for Hypertension, Diabetes, Heart disease, and Cancer.  ROS:   Please see the history of present illness.    All other systems reviewed and are negative.  EKGs/Labs/Other Studies Reviewed:    The following studies were reviewed today: EKG revealed sinus rhythm and nonspecific ST-T changes   Recent Labs: 03/07/2022: BUN 21; Creatinine, Ser 1.57; Hemoglobin 14.5; Platelets 201; Potassium 4.1; Sodium 136  Recent  Lipid Panel No results found for: "CHOL", "TRIG", "HDL", "CHOLHDL", "VLDL", "LDLCALC", "LDLDIRECT"  Physical Exam:    VS:  BP 138/68   Pulse 70   Ht '6\' 2"'$  (1.88 m)   Wt 213 lb 6.4 oz (96.8 kg)   SpO2 96%   BMI 27.40 kg/m     Wt Readings from Last 3 Encounters:  03/20/22 213 lb 6.4 oz (96.8 kg)  06/07/21 217 lb (98.4 kg)  03/31/21 216 lb (98 kg)     GEN: Patient is in no acute distress HEENT: Normal NECK: No JVD; No carotid bruits LYMPHATICS: No lymphadenopathy CARDIAC: Hear sounds regular, 2/6 systolic murmur at the apex. RESPIRATORY:  Clear to auscultation without rales, wheezing or rhonchi  ABDOMEN: Soft, non-tender, non-distended MUSCULOSKELETAL:  No edema; No deformity  SKIN: Warm and dry NEUROLOGIC:  Alert and oriented x 3  PSYCHIATRIC:  Normal affect   Signed, Troy Lindau, MD  03/20/2022 10:32 AM    [facility]

## 2022-03-20 NOTE — Patient Instructions (Signed)
Medication Instructions:  Your physician has recommended you make the following change in your medication:   Decrease your Eliquis to 2.5 mg twice daily due to your creatine.   *If you need a refill on your cardiac medications before your next appointment, please call your pharmacy*   Lab Work: None ordered If you have labs (blood work) drawn today and your tests are completely normal, you will receive your results only by: [place] (if you have MyChart) OR A paper copy in the mail If you have any lab test that is abnormal or we need to change your treatment, we will call you to review the results.   Testing/Procedures: None ordered   Follow-Up: At [facility], you and your health needs are our priority.  As part of our continuing mission to provide you with exceptional heart care, we have created designated Provider Care Teams.  These Care Teams include your primary Cardiologist (physician) and Advanced Practice Providers (APPs -  Physician Assistants and Nurse Practitioners) who all work together to provide you with the care you need, when you need it.  We recommend signing up for the patient portal called "MyChart".  Sign up information is provided on this After Visit Summary.  MyChart is used to connect with patients for Virtual Visits (Telemedicine).  Patients are able to view lab/test results, encounter notes, upcoming appointments, etc.  Non-urgent messages can be sent to your provider as well.   To learn more about what you can do with MyChart, go to [email or web address].    Your next appointment:   9 month(s)  The format for your next appointment:   In Person  Provider:   Jyl Heinz, MD    Other Instructions   Important Information About Sugar

## 2022-03-22 DIAGNOSIS — N3000 Acute cystitis without hematuria: Secondary | ICD-10-CM | POA: Diagnosis not present

## 2022-03-22 DIAGNOSIS — Z87440 Personal history of urinary (tract) infections: Secondary | ICD-10-CM | POA: Diagnosis not present

## 2022-03-22 DIAGNOSIS — N1832 Chronic kidney disease, stage 3b: Secondary | ICD-10-CM | POA: Diagnosis not present

## 2022-03-22 DIAGNOSIS — R531 Weakness: Secondary | ICD-10-CM | POA: Diagnosis not present

## 2022-06-26 ENCOUNTER — Other Ambulatory Visit: Payer: Self-pay | Admitting: *Deleted

## 2022-06-26 ENCOUNTER — Encounter: Payer: Self-pay | Admitting: Cardiology

## 2022-06-26 MED ORDER — APIXABAN 2.5 MG PO TABS: 2.5000 mg | ORAL_TABLET | Freq: Two times a day (BID) | ORAL | 1 refills | Status: DC

## 2022-06-26 NOTE — Telephone Encounter (Signed)
Prescription refill request for Eliquis received. Indication: PAF Last office visit: 03/20/22  R Revankar MD Scr: 1.57 on 03/07/22 Age: 89 Weight: 96.8kg  Based on above findings Eliquis 2.5mg  twice daily is the appropriate dose.  Refill approved.

## 2022-07-10 DIAGNOSIS — E119 Type 2 diabetes mellitus without complications: Secondary | ICD-10-CM | POA: Diagnosis not present

## 2022-07-10 DIAGNOSIS — H353131 Nonexudative age-related macular degeneration, bilateral, early dry stage: Secondary | ICD-10-CM | POA: Diagnosis not present

## 2022-07-10 DIAGNOSIS — Z0100 Encounter for examination of eyes and vision without abnormal findings: Secondary | ICD-10-CM | POA: Diagnosis not present

## 2022-08-09 DIAGNOSIS — D229 Melanocytic nevi, unspecified: Secondary | ICD-10-CM | POA: Diagnosis not present

## 2022-08-09 DIAGNOSIS — M545 Low back pain, unspecified: Secondary | ICD-10-CM | POA: Diagnosis not present

## 2022-08-09 DIAGNOSIS — I4891 Unspecified atrial fibrillation: Secondary | ICD-10-CM | POA: Diagnosis not present

## 2022-08-09 DIAGNOSIS — F0390 Unspecified dementia without behavioral disturbance: Secondary | ICD-10-CM | POA: Diagnosis not present

## 2022-08-09 DIAGNOSIS — Z1389 Encounter for screening for other disorder: Secondary | ICD-10-CM | POA: Diagnosis not present

## 2022-08-09 DIAGNOSIS — Z8546 Personal history of malignant neoplasm of prostate: Secondary | ICD-10-CM | POA: Diagnosis not present

## 2022-08-09 DIAGNOSIS — N182 Chronic kidney disease, stage 2 (mild): Secondary | ICD-10-CM | POA: Diagnosis not present

## 2022-08-09 DIAGNOSIS — Z0000 Encounter for general adult medical examination without abnormal findings: Secondary | ICD-10-CM | POA: Diagnosis not present

## 2022-08-09 DIAGNOSIS — E1142 Type 2 diabetes mellitus with diabetic polyneuropathy: Secondary | ICD-10-CM | POA: Diagnosis not present

## 2022-08-23 DIAGNOSIS — N200 Calculus of kidney: Secondary | ICD-10-CM | POA: Diagnosis not present

## 2022-08-23 DIAGNOSIS — N3020 Other chronic cystitis without hematuria: Secondary | ICD-10-CM | POA: Diagnosis not present

## 2022-09-04 DIAGNOSIS — D225 Melanocytic nevi of trunk: Secondary | ICD-10-CM | POA: Diagnosis not present

## 2022-09-04 DIAGNOSIS — D485 Neoplasm of uncertain behavior of skin: Secondary | ICD-10-CM | POA: Diagnosis not present

## 2022-12-04 DIAGNOSIS — R69 Illness, unspecified: Secondary | ICD-10-CM | POA: Diagnosis not present

## 2023-01-06 DIAGNOSIS — R69 Illness, unspecified: Secondary | ICD-10-CM | POA: Diagnosis not present

## 2023-01-24 DIAGNOSIS — I4891 Unspecified atrial fibrillation: Secondary | ICD-10-CM | POA: Insufficient documentation

## 2023-01-26 ENCOUNTER — Ambulatory Visit: Payer: Medicare HMO | Attending: Cardiology | Admitting: Cardiology

## 2023-01-26 ENCOUNTER — Encounter: Payer: Self-pay | Admitting: Cardiology

## 2023-01-26 VITALS — BP 132/70 | HR 66 | Ht 74.0 in | Wt 204.8 lb

## 2023-01-26 DIAGNOSIS — I480 Paroxysmal atrial fibrillation: Secondary | ICD-10-CM

## 2023-01-26 DIAGNOSIS — E1142 Type 2 diabetes mellitus with diabetic polyneuropathy: Secondary | ICD-10-CM | POA: Diagnosis not present

## 2023-01-26 DIAGNOSIS — E782 Mixed hyperlipidemia: Secondary | ICD-10-CM | POA: Diagnosis not present

## 2023-01-26 NOTE — Progress Notes (Signed)
 Cardiology Office Note:    Date:  01/26/2023   ID:  Troy Mack, DOB 13-Sep-1927, MRN [id number]  PCP:  Troy Males, MD  Cardiologist:  Troy JONELLE Crape, MD   Referring MD: Troy Males, MD    ASSESSMENT:    1. Paroxysmal atrial fibrillation (HCC)   2. Type 2 diabetes mellitus with polyneuropathy (HCC)   3. Mixed hyperlipidemia    PLAN:    In order of problems listed above:  Primary prevention stressed with the patient.  Importance of compliance with diet medication stressed and patient verbalized standing. Paroxysmal atrial fibrillation:I discussed with the patient atrial fibrillation, disease process. Management and therapy including rate and rhythm control, anticoagulation benefits and potential risks were discussed extensively with the patient. Patient had multiple questions which were answered to patient's satisfaction.  He ambulates with a cane and has not had any falls. Mixed dyslipidemia: On lipid-lowering medications followed by primary care. Diabetes mellitus: Diet emphasized.  Managed by primary care.  He and his daughter unavailability of this and they are taking the precautions. Patient will be seen in follow-up appointment in 12 months or earlier if the patient has any concerns.    Medication Adjustments/Labs and Tests Ordered: Current medicines are reviewed at length with the patient today.  Concerns regarding medicines are outlined above.  No orders of the defined types were placed in this encounter.  No orders of the defined types were placed in this encounter.    No chief complaint on file.    History of Present Illness:    Troy Mack is a 89 y.o. male.  Patient has past medical paroxysmal atrial fibrillation, diabetes mellitus and hyperlipidemia.  He denies any problems at this time and takes care of activities of daily living.  No chest pain orthopnea or PND.  At the time of my evaluation, the patient is alert awake oriented and in no  distress.  He ambulates age appropriately.  His daughter is very supportive and lives nearby.  At the time of my evaluation, the patient is alert awake oriented and in no distress.  Past Medical History:  Diagnosis Date   Atrial fibrillation (HCC)    BMI 27.0-27.9,adult    Cancer [facility])    Prostate May 2010 diagnosed   Chest wall pain 01/18/2021   Chronic renal failure, stage 3b (HCC)    Chronic urinary tract infection 11/24/2020   COVID-19 07/03/2020   Diabetes mellitus without complication (HCC)    Fatigue 09/16/2015   Hemangioma 09/16/2015   Hip pain 01/18/2021   Hyperlipidemia    Kidney stones    Macular degeneration    Palpitations 09/16/2015   Paroxysmal atrial fibrillation (HCC) 02/14/2022   Personal history of malignant neoplasm of prostate 07/25/2012   Formatting of this note might be different from the original. S/p radiation seed implants ~ 2010. Followed by Dr. Watt, [place].   Prostate cancer (HCC)    Reactive depression (situational) 04/17/2020   Renal stones    Serum creatinine raised 07/03/2020   1.25mg /dl (1/68/81--dujaoz from 1.22mg /dl in 7982)   Type 2 diabetes mellitus (HCC)    Type 2 diabetes mellitus with polyneuropathy [facility])     Past Surgical History:  Procedure Laterality Date   APPENDECTOMY     CHOLECYSTECTOMY  01/25/2012   Procedure: LAPAROSCOPIC CHOLECYSTECTOMY;  Surgeon: Dann FORBES Hummer, MD;  Location: [facility] OR;  Service: General;  Laterality: N/A;   EXTRACORPOREAL SHOCK WAVE LITHOTRIPSY Left 03/31/2021   Procedure: LEFT EXTRACORPOREAL SHOCK WAVE LITHOTRIPSY (ESWL);  Surgeon: Lovie Arlyss CROME, MD;  Location: [facility];  Service: Urology;  Laterality: Left;    Current Medications: Current Meds  Medication Sig   acetaminophen  (TYLENOL ) 325 MG tablet Take 2 tablets by mouth as needed for mild pain or moderate pain.   apixaban  (ELIQUIS ) 2.5 MG TABS tablet Take 1 tablet (2.5 mg total) by mouth 2 (two) times daily.   atorvastatin  (LIPITOR) 20 MG tablet Take 20 mg by mouth daily.   cetirizine (ZYRTEC) 10 MG tablet Take 10 mg by mouth daily as needed for allergies.   Cranberry (CRANBEREX PO) Take 4,200 mg by mouth daily.   donepezil (ARICEPT) 5 MG tablet Take 5 mg by mouth daily.   Magnesium 250 MG TABS Take 250 mg by mouth daily.   metFORMIN (GLUCOPHAGE) 500 MG tablet Take 250 mg by mouth 2 (two) times daily.   Multiple Vitamin (MULTIVITAMIN WITH MINERALS) TABS Take 1 tablet by mouth daily.   multivitamin-lutein (OCUVITE-LUTEIN) CAPS capsule Take 1 capsule by mouth 2 (two) times daily.   nystatin (MYCOSTATIN/NYSTOP) powder Apply 1 Application topically as needed (rash).   VITAMIN D PO Take 125 mcg by mouth daily.     Allergies:   Asa [aspirin]   Social History   Socioeconomic History   Marital status: Widowed    Spouse name: Not on file   Number of children: Not on file   Years of education: Not on file   Highest education level: Not on file  Occupational History   Not on file  Tobacco Use   Smoking status: Never   Smokeless tobacco: Never  Substance and Sexual Activity   Alcohol  use: No   Drug use: No   Sexual activity: Not Currently  Other Topics Concern   Not on file  Social History Narrative   Not on file   Social Drivers of Health   Financial Resource Strain: Low Risk  (07/03/2020)   Received from [facility], [facility]   Overall Financial Resource Strain (CARDIA)    Difficulty of Paying Living Expenses: Not hard at all  Food Insecurity: No Food Insecurity (07/03/2020)   [facility]   Hunger Vital Sign    Worried About Running Out of Food in the Last Year: Never true    Ran Out of Food in the Last Year: Never true  Transportation Needs: No Transportation Needs (07/03/2020)   Received from [facility], [facility]   [facility] - Transportation    Lack of Transportation (Medical): No    Lack of Transportation (Non-Medical): No  Physical  Activity: Inactive (07/03/2020)   Received from [facility], [facility]   Exercise Vital Sign    Days of Exercise per Week: 0 days    Minutes of Exercise per Session: 0 min  Stress: No Stress Concern Present (07/03/2020)   Received from [facility], [facility] of Occupational Health - Occupational Stress Questionnaire    Feeling of Stress : Only a little  Social Connections: Moderately Isolated (07/03/2020)   Received from [facility], [facility]   Social Connection and Isolation Panel [NHANES]    Frequency of Communication with Friends and Family: More than three times a week    Frequency of Social Gatherings with Friends and Family: More than three times a week    Attends Religious Services: More than 4 times per year    Active  Member of Clubs or Organizations: No    Attends Banker Meetings: Never    Marital Status: Widowed     Family History: The patient's family history is negative for Hypertension, Diabetes, Heart disease, and Cancer.  ROS:   Please see the history of present illness.    All other systems reviewed and are negative.  EKGs/Labs/Other Studies Reviewed:    The following studies were reviewed today: .SABRA  I discussed my findings with the patient at length.    Recent Labs: 03/07/2022: BUN 21; Creatinine, Ser 1.57; Hemoglobin 14.5; Platelets 201; Potassium 4.1; Sodium 136  Recent Lipid Panel No results found for: CHOL, TRIG, HDL, CHOLHDL, VLDL, LDLCALC, LDLDIRECT  Physical Exam:    VS:  BP 132/70   Pulse 66   Ht 6' 2 (1.88 m)   Wt 204 lb 12.8 oz (92.9 kg)   SpO2 96%   BMI 26.29 kg/m     Wt Readings from Last 3 Encounters:  01/26/23 204 lb 12.8 oz (92.9 kg)  03/20/22 213 lb 6.4 oz (96.8 kg)  06/07/21 217 lb (98.4 kg)     GEN: Patient is in no acute distress HEENT: Normal NECK: No JVD; No carotid bruits LYMPHATICS: No lymphadenopathy CARDIAC: Hear sounds regular, 2/6 systolic  murmur at the apex. RESPIRATORY:  Clear to auscultation without rales, wheezing or rhonchi  ABDOMEN: Soft, non-tender, non-distended MUSCULOSKELETAL:  No edema; No deformity  SKIN: Warm and dry NEUROLOGIC:  Alert and oriented x 3 PSYCHIATRIC:  Normal affect   Signed, Troy JONELLE Crape, MD  01/26/2023 10:25 AM    [facility]

## 2023-01-26 NOTE — Patient Instructions (Signed)

## 2023-02-22 DIAGNOSIS — N200 Calculus of kidney: Secondary | ICD-10-CM | POA: Diagnosis not present

## 2023-03-15 DIAGNOSIS — E1169 Type 2 diabetes mellitus with other specified complication: Secondary | ICD-10-CM | POA: Diagnosis not present

## 2023-03-15 DIAGNOSIS — E7800 Pure hypercholesterolemia, unspecified: Secondary | ICD-10-CM | POA: Diagnosis not present

## 2023-05-07 DIAGNOSIS — R197 Diarrhea, unspecified: Secondary | ICD-10-CM | POA: Insufficient documentation

## 2023-05-07 DIAGNOSIS — N189 Chronic kidney disease, unspecified: Secondary | ICD-10-CM | POA: Diagnosis not present

## 2023-05-07 DIAGNOSIS — R1110 Vomiting, unspecified: Secondary | ICD-10-CM | POA: Diagnosis not present

## 2023-05-07 DIAGNOSIS — R7989 Other specified abnormal findings of blood chemistry: Secondary | ICD-10-CM | POA: Diagnosis not present

## 2023-05-07 DIAGNOSIS — R1114 Bilious vomiting: Secondary | ICD-10-CM | POA: Insufficient documentation

## 2023-05-07 DIAGNOSIS — R531 Weakness: Secondary | ICD-10-CM | POA: Diagnosis not present

## 2023-05-07 DIAGNOSIS — E1142 Type 2 diabetes mellitus with diabetic polyneuropathy: Secondary | ICD-10-CM | POA: Diagnosis not present

## 2023-05-07 DIAGNOSIS — E119 Type 2 diabetes mellitus without complications: Secondary | ICD-10-CM | POA: Diagnosis not present

## 2023-05-07 DIAGNOSIS — R079 Chest pain, unspecified: Secondary | ICD-10-CM | POA: Diagnosis not present

## 2023-05-07 DIAGNOSIS — I480 Paroxysmal atrial fibrillation: Secondary | ICD-10-CM | POA: Diagnosis not present

## 2023-05-07 DIAGNOSIS — N183 Chronic kidney disease, stage 3 unspecified: Secondary | ICD-10-CM | POA: Diagnosis not present

## 2023-05-07 DIAGNOSIS — G3184 Mild cognitive impairment, so stated: Secondary | ICD-10-CM | POA: Diagnosis not present

## 2023-05-07 DIAGNOSIS — E1122 Type 2 diabetes mellitus with diabetic chronic kidney disease: Secondary | ICD-10-CM | POA: Diagnosis not present

## 2023-05-07 DIAGNOSIS — Z8546 Personal history of malignant neoplasm of prostate: Secondary | ICD-10-CM | POA: Diagnosis not present

## 2023-05-07 DIAGNOSIS — Z79899 Other long term (current) drug therapy: Secondary | ICD-10-CM | POA: Diagnosis not present

## 2023-05-08 DIAGNOSIS — Z79899 Other long term (current) drug therapy: Secondary | ICD-10-CM | POA: Diagnosis not present

## 2023-05-08 DIAGNOSIS — R7989 Other specified abnormal findings of blood chemistry: Secondary | ICD-10-CM | POA: Diagnosis not present

## 2023-05-08 DIAGNOSIS — E1142 Type 2 diabetes mellitus with diabetic polyneuropathy: Secondary | ICD-10-CM | POA: Diagnosis not present

## 2023-05-08 DIAGNOSIS — I480 Paroxysmal atrial fibrillation: Secondary | ICD-10-CM | POA: Diagnosis not present

## 2023-05-08 DIAGNOSIS — N183 Chronic kidney disease, stage 3 unspecified: Secondary | ICD-10-CM | POA: Diagnosis not present

## 2023-05-08 DIAGNOSIS — G3184 Mild cognitive impairment, so stated: Secondary | ICD-10-CM | POA: Diagnosis not present

## 2023-05-08 DIAGNOSIS — M79644 Pain in right finger(s): Secondary | ICD-10-CM | POA: Diagnosis not present

## 2023-05-08 DIAGNOSIS — R531 Weakness: Secondary | ICD-10-CM | POA: Diagnosis not present

## 2023-05-08 DIAGNOSIS — E1122 Type 2 diabetes mellitus with diabetic chronic kidney disease: Secondary | ICD-10-CM | POA: Diagnosis not present

## 2023-05-09 DIAGNOSIS — R197 Diarrhea, unspecified: Secondary | ICD-10-CM | POA: Diagnosis not present

## 2023-05-09 DIAGNOSIS — I480 Paroxysmal atrial fibrillation: Secondary | ICD-10-CM | POA: Diagnosis not present

## 2023-05-09 DIAGNOSIS — R7989 Other specified abnormal findings of blood chemistry: Secondary | ICD-10-CM | POA: Diagnosis not present

## 2023-05-09 DIAGNOSIS — F0390 Unspecified dementia without behavioral disturbance: Secondary | ICD-10-CM | POA: Diagnosis not present

## 2023-05-09 DIAGNOSIS — R531 Weakness: Secondary | ICD-10-CM | POA: Diagnosis not present

## 2023-05-09 DIAGNOSIS — R1110 Vomiting, unspecified: Secondary | ICD-10-CM | POA: Diagnosis not present

## 2023-05-09 DIAGNOSIS — Z7901 Long term (current) use of anticoagulants: Secondary | ICD-10-CM | POA: Diagnosis not present

## 2023-05-09 DIAGNOSIS — E1140 Type 2 diabetes mellitus with diabetic neuropathy, unspecified: Secondary | ICD-10-CM | POA: Diagnosis not present

## 2023-05-09 DIAGNOSIS — N189 Chronic kidney disease, unspecified: Secondary | ICD-10-CM | POA: Diagnosis not present

## 2023-05-09 DIAGNOSIS — Z79899 Other long term (current) drug therapy: Secondary | ICD-10-CM | POA: Diagnosis not present

## 2023-05-10 DIAGNOSIS — E1142 Type 2 diabetes mellitus with diabetic polyneuropathy: Secondary | ICD-10-CM | POA: Diagnosis not present

## 2023-05-10 DIAGNOSIS — Z792 Long term (current) use of antibiotics: Secondary | ICD-10-CM | POA: Diagnosis not present

## 2023-05-10 DIAGNOSIS — Z7901 Long term (current) use of anticoagulants: Secondary | ICD-10-CM | POA: Diagnosis not present

## 2023-05-10 DIAGNOSIS — N189 Chronic kidney disease, unspecified: Secondary | ICD-10-CM | POA: Diagnosis not present

## 2023-05-10 DIAGNOSIS — E1122 Type 2 diabetes mellitus with diabetic chronic kidney disease: Secondary | ICD-10-CM | POA: Diagnosis not present

## 2023-05-10 DIAGNOSIS — R531 Weakness: Secondary | ICD-10-CM | POA: Diagnosis not present

## 2023-05-10 DIAGNOSIS — Z7902 Long term (current) use of antithrombotics/antiplatelets: Secondary | ICD-10-CM | POA: Diagnosis not present

## 2023-05-10 DIAGNOSIS — R1110 Vomiting, unspecified: Secondary | ICD-10-CM | POA: Diagnosis not present

## 2023-05-10 DIAGNOSIS — I480 Paroxysmal atrial fibrillation: Secondary | ICD-10-CM | POA: Diagnosis not present

## 2023-05-10 DIAGNOSIS — F0390 Unspecified dementia without behavioral disturbance: Secondary | ICD-10-CM | POA: Diagnosis not present

## 2023-05-10 DIAGNOSIS — E7989 Other specified disorders of purine and pyrimidine metabolism: Secondary | ICD-10-CM | POA: Diagnosis not present

## 2023-05-11 DIAGNOSIS — A0811 Acute gastroenteropathy due to Norwalk agent: Secondary | ICD-10-CM | POA: Insufficient documentation

## 2023-05-11 DIAGNOSIS — R531 Weakness: Secondary | ICD-10-CM | POA: Diagnosis not present

## 2023-05-12 DIAGNOSIS — R531 Weakness: Secondary | ICD-10-CM | POA: Diagnosis not present

## 2023-05-13 DIAGNOSIS — R531 Weakness: Secondary | ICD-10-CM | POA: Diagnosis not present

## 2023-05-14 DIAGNOSIS — R531 Weakness: Secondary | ICD-10-CM | POA: Diagnosis not present

## 2023-05-15 DIAGNOSIS — R531 Weakness: Secondary | ICD-10-CM | POA: Diagnosis not present

## 2023-05-15 DIAGNOSIS — Z8546 Personal history of malignant neoplasm of prostate: Secondary | ICD-10-CM | POA: Diagnosis not present

## 2023-05-15 DIAGNOSIS — E785 Hyperlipidemia, unspecified: Secondary | ICD-10-CM | POA: Diagnosis not present

## 2023-05-15 DIAGNOSIS — B338 Other specified viral diseases: Secondary | ICD-10-CM | POA: Diagnosis not present

## 2023-05-15 DIAGNOSIS — R7989 Other specified abnormal findings of blood chemistry: Secondary | ICD-10-CM | POA: Diagnosis not present

## 2023-05-15 DIAGNOSIS — I4891 Unspecified atrial fibrillation: Secondary | ICD-10-CM | POA: Diagnosis not present

## 2023-05-15 DIAGNOSIS — N183 Chronic kidney disease, stage 3 unspecified: Secondary | ICD-10-CM | POA: Diagnosis not present

## 2023-05-28 DIAGNOSIS — A084 Viral intestinal infection, unspecified: Secondary | ICD-10-CM | POA: Diagnosis not present

## 2023-05-28 DIAGNOSIS — R531 Weakness: Secondary | ICD-10-CM | POA: Diagnosis not present

## 2023-05-28 DIAGNOSIS — E1169 Type 2 diabetes mellitus with other specified complication: Secondary | ICD-10-CM | POA: Diagnosis not present

## 2023-06-01 ENCOUNTER — Telehealth: Payer: Self-pay | Admitting: Cardiology

## 2023-06-01 NOTE — Telephone Encounter (Signed)
 Pt's daughter calling to advise that Physical therapist came out today but would not work with pt due to - bp 176/74 at first arrival Rechecked 140/72  hr slightly irreg asymptomatic hr running 45-47

## 2023-06-04 ENCOUNTER — Encounter: Payer: Self-pay | Admitting: Cardiology

## 2023-06-04 NOTE — Telephone Encounter (Signed)
 Patient's daughter is following up requesting recommendations. Please advise.

## 2023-06-05 NOTE — Telephone Encounter (Signed)
Sent in MyChart message.

## 2023-06-07 DIAGNOSIS — Z7189 Other specified counseling: Secondary | ICD-10-CM | POA: Insufficient documentation

## 2023-06-12 ENCOUNTER — Ambulatory Visit: Attending: Cardiology

## 2023-06-12 ENCOUNTER — Ambulatory Visit: Attending: Cardiology | Admitting: Cardiology

## 2023-06-12 ENCOUNTER — Encounter: Payer: Self-pay | Admitting: Cardiology

## 2023-06-12 VITALS — BP 140/74 | HR 52 | Ht 74.0 in | Wt 203.2 lb

## 2023-06-12 DIAGNOSIS — N1832 Chronic kidney disease, stage 3b: Secondary | ICD-10-CM

## 2023-06-12 DIAGNOSIS — E119 Type 2 diabetes mellitus without complications: Secondary | ICD-10-CM

## 2023-06-12 DIAGNOSIS — I480 Paroxysmal atrial fibrillation: Secondary | ICD-10-CM

## 2023-06-12 DIAGNOSIS — E782 Mixed hyperlipidemia: Secondary | ICD-10-CM

## 2023-06-12 NOTE — Progress Notes (Signed)
 Cardiology Office Note:    Date:  06/12/2023   ID:  Troy Mack, DOB 1927-07-28, MRN [id number]  PCP:  Orin Birk, MD  Cardiologist:  Ralene Burger, MD    Referring MD: Orin Birk, MD   Chief Complaint  Patient presents with   Follow-up    History of Present Illness:    Troy Mack is a 89 y.o. male past medical history significant for paroxysmal atrial fibrillation, diabetes, hyperlipidemia recently few weeks ago about a month ago he ended up going to [facility] because of vomiting and diarrhea he did have norovirus he was also found to be in atrial fibrillation.  Discharged home he does have physical therapy who comes to him on the regular basis he was noted to be bradycardic that is why he was brought to our office.  He denies have any syncope or passing out no nightmares, he is happy sleeps well.  He still lives alone he comes today to my office with his son son-in-law who is visiting me quite often as well as his wife who is his daughter today watching him quite carefully so far he seems to be doing well no chest pain tightness squeezing pressure burning chest no palpitations.  Today he is in atrial fibrillation with slow ventricular rate  Past Medical History:  Diagnosis Date   Atrial fibrillation (HCC)    BMI 27.0-27.9,adult    Cancer [facility])    Prostate May 2010 diagnosed   Chest wall pain 01/18/2021   Chronic renal failure, stage 3b (HCC)    Chronic urinary tract infection 11/24/2020   COVID-19 07/03/2020   Diabetes mellitus without complication (HCC)    Fatigue 09/16/2015   Hemangioma 09/16/2015   Hip pain 01/18/2021   Hyperlipidemia    Kidney stones    Macular degeneration    Palpitations 09/16/2015   Paroxysmal atrial fibrillation (HCC) 02/14/2022   Personal history of malignant neoplasm of prostate 07/25/2012   Formatting of this note might be different from the original. S/p radiation seed implants ~ 2010. Followed by Dr. Inga Manges,  [place].   Prostate cancer (HCC)    Reactive depression (situational) 04/17/2020   Renal stones    Serum creatinine raised 07/03/2020   1.25mg /dl (04/25/79--XBJYNW from 1.22mg /dl in 2956)   Type 2 diabetes mellitus (HCC)    Type 2 diabetes mellitus with polyneuropathy [facility])     Past Surgical History:  Procedure Laterality Date   APPENDECTOMY     CHOLECYSTECTOMY  01/25/2012   Procedure: LAPAROSCOPIC CHOLECYSTECTOMY;  Surgeon: Cloyce Darby, MD;  Location: [facility] OR;  Service: General;  Laterality: N/A;   EXTRACORPOREAL SHOCK WAVE LITHOTRIPSY Left 03/31/2021   Procedure: LEFT EXTRACORPOREAL SHOCK WAVE LITHOTRIPSY (ESWL);  Surgeon: Mallie Seal, MD;  Location: [facility];  Service: Urology;  Laterality: Left;    Current Medications: Current Meds  Medication Sig   acetaminophen  (TYLENOL ) 325 MG tablet Take 2 tablets by mouth as needed for mild pain or moderate pain.   apixaban  (ELIQUIS ) 2.5 MG TABS tablet Take 1 tablet (2.5 mg total) by mouth 2 (two) times daily.   atorvastatin (LIPITOR) 20 MG tablet Take 20 mg by mouth daily.   cetirizine (ZYRTEC) 10 MG tablet Take 10 mg by mouth daily as needed for allergies.   donepezil (ARICEPT) 10 MG tablet Take 5 mg by mouth daily.   Magnesium 250 MG TABS Take 250 mg by mouth daily.   metFORMIN (GLUCOPHAGE) 500 MG tablet Take 250 mg by mouth 2 (  two) times daily.   Multiple Vitamin (MULTIVITAMIN WITH MINERALS) TABS Take 1 tablet by mouth daily.   multivitamin-lutein (OCUVITE-LUTEIN) CAPS capsule Take 1 capsule by mouth 2 (two) times daily.   nystatin (MYCOSTATIN/NYSTOP) powder Apply 1 Application topically as needed (rash).   VITAMIN D PO Take 125 mcg by mouth daily.   [DISCONTINUED] Cranberry (CRANBEREX PO) Take 4,200 mg by mouth daily.     Allergies:   Asa [aspirin]   Social History   Socioeconomic History   Marital status: Widowed    Spouse name: Not on file   Number of children: Not on file   Years of education: Not  on file   Highest education level: Not on file  Occupational History   Not on file  Tobacco Use   Smoking status: Never   Smokeless tobacco: Never  Substance and Sexual Activity   Alcohol  use: No   Drug use: No   Sexual activity: Not Currently  Other Topics Concern   Not on file  Social History Narrative   Not on file   Social Drivers of Health   Financial Resource Strain: Low Risk  (07/03/2020)   Received [facility], [facility]   Overall Financial Resource Strain (CARDIA)    Difficulty of Paying Living Expenses: Not hard at all  Food Insecurity: No Food Insecurity (07/03/2020)   Received from [facility], [facility]   Hunger Vital Sign    Worried About Running Out of Food in the Last Year: Never true    Ran Out of Food in the Last Year: Never true  Transportation Needs: No Transportation Needs (07/03/2020)   Received from [facility], [facility]   [facility] - Transportation    Lack of Transportation (Medical): No    Lack of Transportation (Non-Medical): No  Physical Activity: Inactive (07/03/2020)   Received from [facility], [facility]   Exercise Vital Sign    Days of Exercise per Week: 0 days    Minutes of Exercise per Session: 0 min  Stress: No Stress Concern Present (07/03/2020)   Received from [facility], [facility] of Occupational Health - Occupational Stress Questionnaire    Feeling of Stress : Only a little  Social Connections: Moderately Isolated (07/03/2020)   Received from [facility], [facility]   Social Connection and Isolation Panel [NHANES]    Frequency of Communication with Friends and Family: More than three times a week    Frequency of Social Gatherings with Friends and Family: More than three times a week    Attends Religious Services: More than 4 times per year    Active Member of [employer] or Organizations: No    Attends Banker Meetings: Never    Marital Status:  Widowed     Family History: The patient's family history is negative for Hypertension, Diabetes, Heart disease, and Cancer. ROS:   Please see the history of present illness.    All 14 point review of systems negative except as described per history of present illness  EKGs/Labs/Other Studies Reviewed:    EKG Interpretation Date/Time:  Tuesday Jun 12 2023 13:22:50 EDT Ventricular Rate:  52 PR Interval:    QRS Duration:  128 QT Interval:  448 QTC Calculation: 416 R Axis:   -79  Text Interpretation: Atrial fibrillation with slow ventricular response Left axis deviation Non-specific intra-ventricular conduction block Cannot rule out Anterior infarct , age undetermined  Abnormal ECG Confirmed by Ralene Burger [phone]) on 06/12/2023 1:28:34 PM    Recent Labs: No results found for requested labs within last 365 days.  Recent Lipid Panel No results found for: "CHOL", "TRIG", "HDL", "CHOLHDL", "VLDL", "LDLCALC", "LDLDIRECT"  Physical Exam:    VS:  BP (!) 140/74 (BP Location: Right Arm, Patient Position: Sitting)   Pulse (!) 52   Ht 6\' 2"  (1.88 m)   Wt 203 lb 3.2 oz (92.2 kg)   SpO2 95%   BMI 26.09 kg/m     Wt Readings from Last 3 Encounters:  06/12/23 203 lb 3.2 oz (92.2 kg)  01/26/23 204 lb 12.8 oz (92.9 kg)  03/20/22 213 lb 6.4 oz (96.8 kg)     GEN:  Well nourished, well developed in no acute distress HEENT: Normal NECK: No JVD; No carotid bruits LYMPHATICS: No lymphadenopathy CARDIAC: Irregularly, no murmurs, no rubs, no gallops RESPIRATORY:  Clear to auscultation without rales, wheezing or rhonchi  ABDOMEN: Soft, non-tender, non-distended MUSCULOSKELETAL:  No edema; No deformity  SKIN: Warm and dry LOWER EXTREMITIES: no swelling NEUROLOGIC:  Alert and oriented x 3 PSYCHIATRIC:  Normal affect   ASSESSMENT:    1. Paroxysmal atrial fibrillation (HCC)   2. Diabetes mellitus without complication (HCC)   3. Chronic renal failure, stage 3b (HCC)   4. Mixed  hyperlipidemia    PLAN:    In order of problems listed above:  Paroxysmal atrial fibrillation now appears to be persistent.  Rate is very slow, will put Zio patch on him for 2 weeks to see was the burden of atrial fibrillation is not really was the heart rate is.  He does not have any symptoms at least he denies having any symptoms, he is taking Aricept if he truly significantly bradycardic we may be forced to discontinue this medication. Diabetes that is followed by internal medicine team I do see hemoglobin A1c from February which is 6.2 quite decent control continue present management. Dyslipidemia did review K PN cholesterol 110 HDL 36 continue present management   Medication Adjustments/Labs and Tests Ordered: Current medicines are reviewed at length with the patient today.  Concerns regarding medicines are outlined above.  Orders Placed This Encounter  Procedures   EKG 12-Lead   Medication changes: No orders of the defined types were placed in this encounter.   Signed, Manfred Seed, MD, [facility] 06/12/2023 1:42 PM    [facility]

## 2023-06-12 NOTE — Patient Instructions (Signed)
 Medication Instructions:  Your physician recommends that you continue on your current medications as directed. Please refer to the Current Medication list given to you today.  *If you need a refill on your cardiac medications before your next appointment, please call your pharmacy*   Lab Work: None Ordered If you have labs (blood work) drawn today and your tests are completely normal, you will receive your results only by: MyChart Message (if you have MyChart) OR A paper copy in the mail If you have any lab test that is abnormal or we need to change your treatment, we will call you to review the results.   Testing/Procedures:  WHY IS MY DOCTOR PRESCRIBING ZIO? The Zio system is proven and trusted by physicians to detect and diagnose irregular heart rhythms -- and has been prescribed to hundreds of thousands of patients.  The FDA has cleared the Zio system to monitor for many different kinds of irregular heart rhythms. In a study, physicians were able to reach a diagnosis 90% of the time with the Zio system1.  You can wear the Zio monitor -- a small, discreet, comfortable patch -- during your normal day-to-day activity, including while you sleep, shower, and exercise, while it records every single heartbeat for analysis.  1Barrett, P., et al. Comparison of 24 Hour Holter Monitoring Versus 14 Day Novel Adhesive Patch Electrocardiographic Monitoring. American Journal of Medicine, 2014.  ZIO VS. HOLTER MONITORING The Zio monitor can be comfortably worn for up to 14 days. Holter monitors can be worn for 24 to 48 hours, limiting the time to record any irregular heart rhythms you may have. Zio is able to capture data for the 51% of patients who have their first symptom-triggered arrhythmia after 48 hours.1  LIVE WITHOUT RESTRICTIONS The Zio ambulatory cardiac monitor is a small, unobtrusive, and water-resistant patch--you might even forget you're wearing it. The Zio monitor records and stores  every beat of your heart, whether you're sleeping, working out, or showering.     Follow-Up: At [facility], you and your health needs are our priority.  As part of our continuing mission to provide you with exceptional heart care, we have created designated Provider Care Teams.  These Care Teams include your primary Cardiologist (physician) and Advanced Practice Providers (APPs -  Physician Assistants and Nurse Practitioners) who all work together to provide you with the care you need, when you need it.  We recommend signing up for the patient portal called "MyChart".  Sign up information is provided on this After Visit Summary.  MyChart is used to connect with patients for Virtual Visits (Telemedicine).  Patients are able to view lab/test results, encounter notes, upcoming appointments, etc.  Non-urgent messages can be sent to your provider as well.   To learn more about what you can do with MyChart, go to [email or web address].    Your next appointment:   3 month(s) with Dr. Lafayette Pierre  The format for your next appointment:   In Person  Provider:   Ralene Burger, MD    Other Instructions NA

## 2023-06-12 NOTE — Addendum Note (Signed)
 Addended by: Shawnee Dellen D on: 06/12/2023 01:48 PM   Modules accepted: Orders

## 2023-06-15 ENCOUNTER — Encounter: Payer: Self-pay | Admitting: Cardiology

## 2023-06-19 DIAGNOSIS — I4891 Unspecified atrial fibrillation: Secondary | ICD-10-CM | POA: Diagnosis not present

## 2023-06-19 DIAGNOSIS — R531 Weakness: Secondary | ICD-10-CM | POA: Diagnosis not present

## 2023-06-19 DIAGNOSIS — N183 Chronic kidney disease, stage 3 unspecified: Secondary | ICD-10-CM | POA: Diagnosis not present

## 2023-06-19 DIAGNOSIS — E1142 Type 2 diabetes mellitus with diabetic polyneuropathy: Secondary | ICD-10-CM | POA: Diagnosis not present

## 2023-06-19 DIAGNOSIS — A0811 Acute gastroenteropathy due to Norwalk agent: Secondary | ICD-10-CM | POA: Diagnosis not present

## 2023-06-19 DIAGNOSIS — E1122 Type 2 diabetes mellitus with diabetic chronic kidney disease: Secondary | ICD-10-CM | POA: Diagnosis not present

## 2023-07-02 DIAGNOSIS — I480 Paroxysmal atrial fibrillation: Secondary | ICD-10-CM | POA: Diagnosis not present

## 2023-07-03 ENCOUNTER — Telehealth: Payer: Self-pay | Admitting: Cardiology

## 2023-07-03 NOTE — Telephone Encounter (Signed)
 Report received from [facility] Afib episode, June 9 Page 14 Strip 7- Report given to Dr. Krasowski.

## 2023-07-03 NOTE — Telephone Encounter (Signed)
 [employer] calling to report results for pt.

## 2023-07-16 DIAGNOSIS — I480 Paroxysmal atrial fibrillation: Secondary | ICD-10-CM

## 2023-07-17 ENCOUNTER — Ambulatory Visit: Payer: Self-pay | Admitting: Cardiology

## 2023-07-19 ENCOUNTER — Telehealth: Payer: Self-pay

## 2023-07-19 NOTE — Telephone Encounter (Signed)
Left message on My Chart with monitor results per Dr. Vanetta Shawl note. Routed to PCP.

## 2023-08-08 ENCOUNTER — Other Ambulatory Visit: Payer: Self-pay | Admitting: Cardiology

## 2023-08-08 MED ORDER — APIXABAN 5 MG PO TABS: 5.0000 mg | ORAL_TABLET | Freq: Two times a day (BID) | ORAL | 0 refills | Status: DC

## 2023-08-08 NOTE — Telephone Encounter (Signed)
 Called spoke with pt's daughter who takes care of pt and goes to all appts with him.  Advised of Eliquis  dosage increase to 5mg  BID given Creat 1.2 and weight 92.2kg.  She states she thought Dr Edwyna put him on 2.5mg  BID of Eliquis  because he wasn't showing evidence of afib.  But pt recently wore 2 week monitor and it showed relatively well-controlled atrial fibrillation with some slowing down but not critical per Dr Bernie.  Is agreeable to increase dosage has PCP appt on 08/27/23 will have repeat labs and ask them to forward results to our office.  Pt also has upcoming appt with Dr Bernie on 09/12/23.  Will re-evaluate dosage in 1 month based on new labwork from 08/27/23.

## 2023-08-08 NOTE — Telephone Encounter (Signed)
 Pt last saw Dr Bernie 06/12/23, last labs 05/11/23 Creat 1.2, age 89, weight 92.2kg, based on specified criteria pt is not on appropriate dosage of Eliquis  2.5mg  BID for afib.  Pt's Creat has been elevated in the past, but most recent Creat 1.2 05/11/23, 05/07/23 Creat 1.4, 08/10/22 Creat 1.47.  Please advise if dosage change appropriate.  Thanks

## 2023-08-08 NOTE — Telephone Encounter (Signed)
 Per Chris Pavero, pharmacist Eliquis  dosage should be increased to 5mg  BID.  90 day supply. Attempted to notify pt of dosage change.  LMOM TCB.

## 2023-09-10 DIAGNOSIS — N182 Chronic kidney disease, stage 2 (mild): Secondary | ICD-10-CM | POA: Diagnosis not present

## 2023-09-10 DIAGNOSIS — E1142 Type 2 diabetes mellitus with diabetic polyneuropathy: Secondary | ICD-10-CM | POA: Diagnosis not present

## 2023-09-10 DIAGNOSIS — D492 Neoplasm of unspecified behavior of bone, soft tissue, and skin: Secondary | ICD-10-CM | POA: Diagnosis not present

## 2023-09-10 DIAGNOSIS — I4891 Unspecified atrial fibrillation: Secondary | ICD-10-CM | POA: Diagnosis not present

## 2023-09-12 ENCOUNTER — Ambulatory Visit: Attending: Cardiology | Admitting: Cardiology

## 2023-09-12 ENCOUNTER — Encounter: Payer: Self-pay | Admitting: Cardiology

## 2023-09-12 VITALS — BP 120/76 | HR 52 | Ht 74.0 in | Wt 205.0 lb

## 2023-09-12 DIAGNOSIS — E782 Mixed hyperlipidemia: Secondary | ICD-10-CM | POA: Diagnosis not present

## 2023-09-12 DIAGNOSIS — E119 Type 2 diabetes mellitus without complications: Secondary | ICD-10-CM

## 2023-09-12 DIAGNOSIS — I4821 Permanent atrial fibrillation: Secondary | ICD-10-CM | POA: Diagnosis not present

## 2023-09-12 DIAGNOSIS — C61 Malignant neoplasm of prostate: Secondary | ICD-10-CM

## 2023-09-12 NOTE — Patient Instructions (Signed)

## 2023-09-12 NOTE — Progress Notes (Signed)
 Cardiology Office Note:    Date:  09/12/2023   ID:  Troy Mack, DOB [birth year], MRN [id number]  PCP:  Gayl Males, MD  Cardiologist:  Lamar Fitch, MD    Referring MD: Gayl Males, MD   No chief complaint on file.   History of Present Illness:    Troy Mack is a 89 y.o. male past medical history significant for paroxysmal atrial fibrillation, diabetes, hyperlipidemia, he came to me because of episode of atrial fibrillation.  I put a monitor on him which show atrial fibrillation throughout recording also episode of bradycardia with some pauses more than 3 seconds likely he is completely absolutely asymptomatic.  He comes with his daughter who participated in the decision making.  He still lives alone his family and his friends will be taking good care of him.  He is able to manage denies have any dizziness or passing out  Past Medical History:  Diagnosis Date   Atrial fibrillation (HCC)    BMI 27.0-27.9,adult    Cancer [facility])    Prostate May 2010 diagnosed   Chest wall pain 01/18/2021   Chronic renal failure, stage 3b (HCC)    Chronic urinary tract infection 11/24/2020   COVID-19 07/03/2020   Diabetes mellitus without complication (HCC)    Fatigue 09/16/2015   Hemangioma 09/16/2015   Hip pain 01/18/2021   Hyperlipidemia    Kidney stones    Macular degeneration    Palpitations 09/16/2015   Paroxysmal atrial fibrillation (HCC) 02/14/2022   Personal history of malignant neoplasm of prostate 07/25/2012   Formatting of this note might be different from the original. S/p radiation seed implants ~ 2010. Followed by Dr. Watt, [place].   Prostate cancer (HCC)    Reactive depression (situational) 04/17/2020   Renal stones    Serum creatinine raised 07/03/2020   1.25mg /dl (1/68/81--dujaoz from 1.22mg /dl in 7982)   Type 2 diabetes mellitus (HCC)    Type 2 diabetes mellitus with polyneuropathy [facility])     Past Surgical History:  Procedure Laterality Date    APPENDECTOMY     CHOLECYSTECTOMY  01/25/2012   Procedure: LAPAROSCOPIC CHOLECYSTECTOMY;  Surgeon: Dann FORBES Hummer, MD;  Location: [facility] OR;  Service: General;  Laterality: N/A;   EXTRACORPOREAL SHOCK WAVE LITHOTRIPSY Left 03/31/2021   Procedure: LEFT EXTRACORPOREAL SHOCK WAVE LITHOTRIPSY (ESWL);  Surgeon: Lovie Arlyss CROME, MD;  Location: [facility];  Service: Urology;  Laterality: Left;    Current Medications: Current Meds  Medication Sig   acetaminophen  (TYLENOL ) 325 MG tablet Take 2 tablets by mouth as needed for mild pain or moderate pain.   apixaban  (ELIQUIS ) 2.5 MG TABS tablet Take 2.5 mg by mouth 2 (two) times daily.   atorvastatin (LIPITOR) 20 MG tablet Take 20 mg by mouth daily.   cetirizine (ZYRTEC) 10 MG tablet Take 10 mg by mouth daily as needed for allergies.   donepezil (ARICEPT) 10 MG tablet Take 5 mg by mouth daily.   Magnesium 250 MG TABS Take 250 mg by mouth daily.   Multiple Vitamin (MULTIVITAMIN WITH MINERALS) TABS Take 1 tablet by mouth daily.   multivitamin-lutein (OCUVITE-LUTEIN) CAPS capsule Take 1 capsule by mouth 2 (two) times daily.   nystatin (MYCOSTATIN/NYSTOP) powder Apply 1 Application topically as needed (rash).   VITAMIN D PO Take 125 mcg by mouth daily.     Allergies:   Asa [aspirin]   Social History   Socioeconomic History   Marital status: Widowed    Spouse name: Not on file  Number of children: Not on file   Years of education: Not on file   Highest education level: Not on file  Occupational History   Not on file  Tobacco Use   Smoking status: Never   Smokeless tobacco: Never  Substance and Sexual Activity   Alcohol  use: No   Drug use: No   Sexual activity: Not Currently  Other Topics Concern   Not on file  Social History Narrative   Not on file   Social Drivers of Health   Financial Resource Strain: Low Risk  (07/03/2020)   Received from [facility]   Overall Financial Resource Strain (CARDIA)    Difficulty of  Paying Living Expenses: Not hard at all  Food Insecurity: No Food Insecurity (07/03/2020)   Received from [facility]   Hunger Vital Sign    Within the past 12 months, you worried that your food would run out before you got the money to buy more.: Never true    Within the past 12 months, the food you bought just didn't last and you didn't have money to get more.: Never true  Transportation Needs: No Transportation Needs (07/03/2020)   Received from [facility]   PRAPARE - Transportation    Lack of Transportation (Medical): No    Lack of Transportation (Non-Medical): No  Physical Activity: Inactive (07/03/2020)   Received from [facility]   Exercise Vital Sign    On average, how many days per week do you engage in moderate to strenuous exercise (like a brisk walk)?: 0 days    On average, how many minutes do you engage in exercise at this level?: 0 min  Stress: No Stress Concern Present (07/03/2020)   Received from [facility] - Occupational Stress Questionnaire    Feeling of Stress : Only a little  Social Connections: Moderately Isolated (07/03/2020)   Received from [facility]   Social Connection and Isolation Panel    In a typical week, how many times do you talk on the phone with family, friends, or neighbors?: More than three times a week    How often do you get together with friends or relatives?: More than three times a week    How often do you attend church or religious services?: More than 4 times per year    Do you belong to any clubs or organizations such as church groups, unions, fraternal or athletic groups, or school groups?: No    How often do you attend meetings of the clubs or organizations you belong to?: Never    Are you married, widowed, divorced, separated, never married, or living with a partner?: Widowed     Family History: The patient's family history is negative for Hypertension, Diabetes, Heart disease,  and Cancer. ROS:   Please see the history of present illness.    All 14 point review of systems negative except as described per history of present illness  EKGs/Labs/Other Studies Reviewed:         Recent Labs: No results found for requested labs within last 365 days.  Recent Lipid Panel No results found for: CHOL, TRIG, HDL, CHOLHDL, VLDL, LDLCALC, LDLDIRECT  Physical Exam:    VS:  BP 120/76   Pulse (!) 52   Ht 6' 2 (1.88 m)   Wt 205 lb (93 kg)   SpO2 93%   BMI 26.32 kg/m     Wt Readings from Last 3  Encounters:  09/12/23 205 lb (93 kg)  06/12/23 203 lb 3.2 oz (92.2 kg)  01/26/23 204 lb 12.8 oz (92.9 kg)     GEN:  Well nourished, well developed in no acute distress HEENT: Normal NECK: No JVD; No carotid bruits LYMPHATICS: No lymphadenopathy CARDIAC: Irregularly irregular, no murmurs, no rubs, no gallops RESPIRATORY:  Clear to auscultation without rales, wheezing or rhonchi  ABDOMEN: Soft, non-tender, non-distended MUSCULOSKELETAL:  No edema; No deformity  SKIN: Warm and dry LOWER EXTREMITIES: no swelling NEUROLOGIC:  Alert and oriented x 3 PSYCHIATRIC:  Normal affect   ASSESSMENT:    1. Permanent atrial fibrillation (HCC)   2. Diabetes mellitus without complication (HCC)   3. Prostate cancer (HCC)   4. Mixed hyperlipidemia    PLAN:    In order of problems listed above:  Atrial fibrillation I think we need to label this as permanent.  It will be difficult to put him on an antiarrhythmic medication because of bradycardia, also I am reluctant to get him back to normal rhythm with cardioversion because of risk of anesthesia in this fragile elderly gentleman at the same time he is doing very well.  His primary care physician reducing his dose of Eliquis  to 2.5 twice daily, he is more than 82, he is kidney function is 1.49 which is kind of borderline but I think it would be safer for him to be on 2.5 twice daily.  I explained to his daughter that signs  and symptoms of worsening of AV block will be dizziness or passing out.  She understands she will let us  know if it happens. Diabetes followed by primary care physician. Prostate cancer stable.   Medication Adjustments/Labs and Tests Ordered: Current medicines are reviewed at length with the patient today.  Concerns regarding medicines are outlined above.  No orders of the defined types were placed in this encounter.  Medication changes: No orders of the defined types were placed in this encounter.   Signed, Lamar DOROTHA Fitch, MD, [facility] 09/12/2023 1:26 PM    [facility]

## 2023-10-19 DIAGNOSIS — H52223 Regular astigmatism, bilateral: Secondary | ICD-10-CM | POA: Diagnosis not present

## 2023-11-05 ENCOUNTER — Other Ambulatory Visit: Payer: Self-pay | Admitting: Cardiology

## 2023-11-06 NOTE — Telephone Encounter (Signed)
 Prescription refill request for Eliquis  received. Indication: PAF Last office visit: 09/12/23  [name] MD Scr: 1.2 on 05/11/23  Epic Age: 89 Weight: 93kg  Based on above findings Eliquis  5mg  twice daily is the appropriate dose.  Refill approved.

## 2023-11-29 IMAGING — DX DG ABDOMEN 1V
2 series · 2 of 2 positions shown · non-contrast
Comparison: 03/02/2021, 02/02/2021.

CLINICAL DATA: Preoperative assessment for lithotripsy.

EXAM:
ABDOMEN - 1 VIEW

[abdomen kub (1 of 2)]
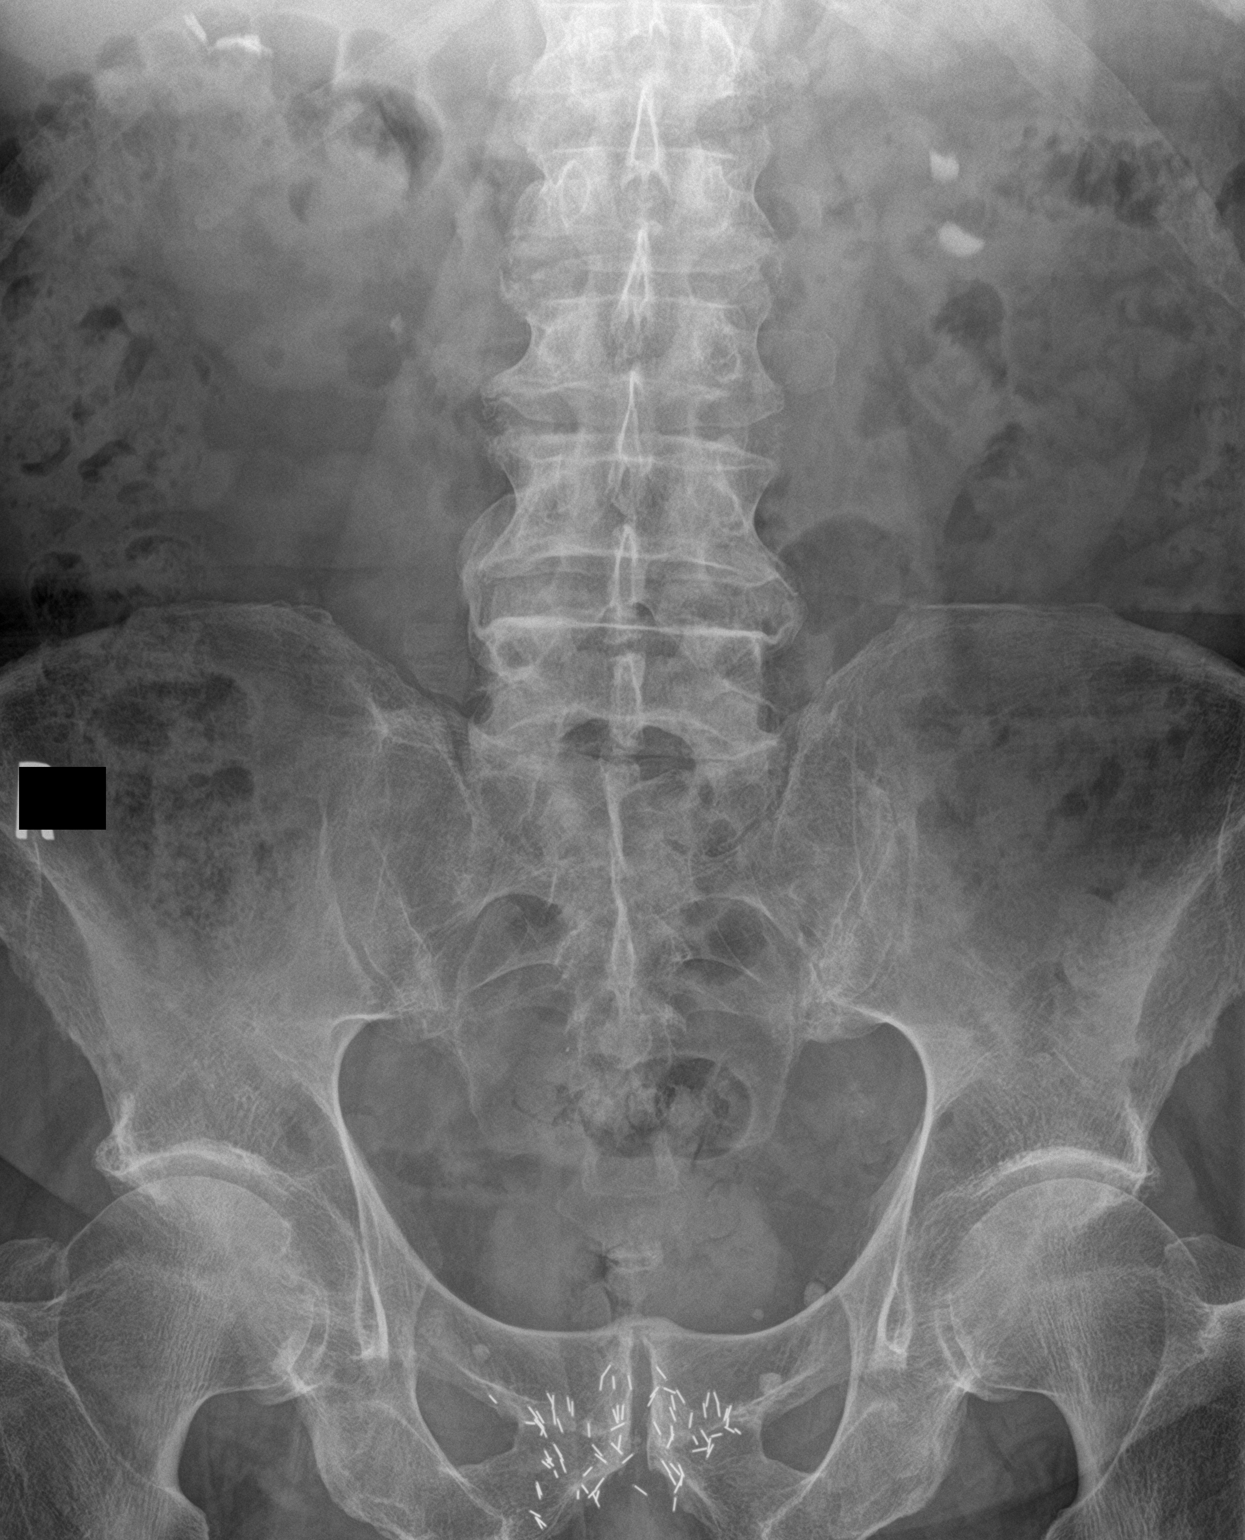

[abdomen kub (2 of 2)]
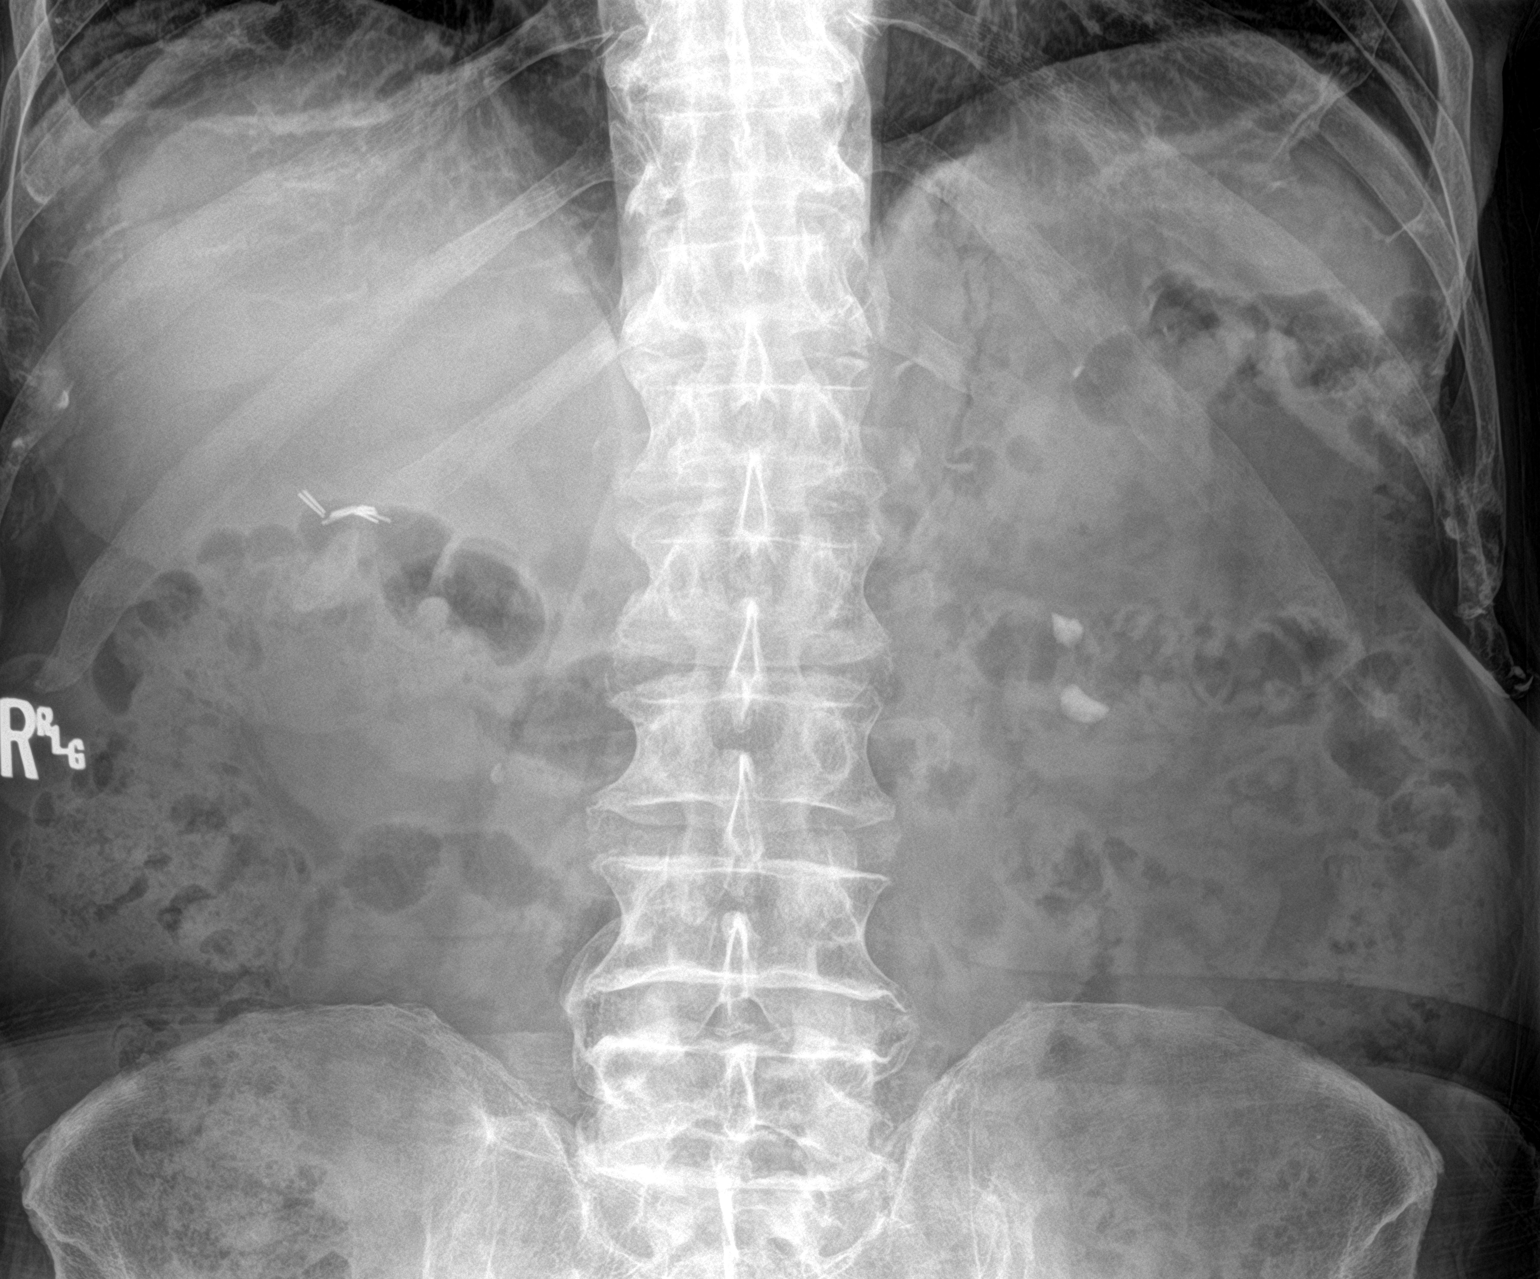

[2 of 2 positions shown; findings below may reference images not displayed]

FINDINGS: The bowel gas pattern is normal. A moderate amount of retained stool
is present in the colon. Renal calculi are present bilaterally
measuring up to 1.1 cm on the left. Cholecystectomy clips are noted
in the right upper quadrant. Brachytherapy seeds are present in the
pelvis.
IMPRESSION: Bilateral nephrolithiasis.

## 2024-03-10 ENCOUNTER — Ambulatory Visit: Admitting: Cardiology

## 2024-03-11 ENCOUNTER — Ambulatory Visit: Admitting: Cardiology

## 2024-03-19 ENCOUNTER — Ambulatory Visit: Admitting: Cardiology
